# Patient Record
Sex: Male | Born: 1980 | Race: Black or African American | Hispanic: No | Marital: Single | Smoking: Current some day smoker
Health system: Southern US, Community
[De-identification: ages and names within clinical notes are randomized; demographics above are authoritative.]

## PROBLEM LIST (undated history)

## (undated) ENCOUNTER — Emergency Department (HOSPITAL_COMMUNITY): Admission: EM | Payer: Self-pay | Source: Home / Self Care

## (undated) DIAGNOSIS — F319 Bipolar disorder, unspecified: Secondary | ICD-10-CM

## (undated) DIAGNOSIS — R011 Cardiac murmur, unspecified: Secondary | ICD-10-CM

## (undated) DIAGNOSIS — J45909 Unspecified asthma, uncomplicated: Secondary | ICD-10-CM

## (undated) DIAGNOSIS — F209 Schizophrenia, unspecified: Secondary | ICD-10-CM

## (undated) HISTORY — PX: FACIAL COSMETIC SURGERY: SHX629

## (undated) HISTORY — PX: WISDOM TOOTH EXTRACTION: SHX21

## (undated) HISTORY — PX: FRACTURE SURGERY: SHX138

---

## 2010-12-05 ENCOUNTER — Emergency Department (HOSPITAL_COMMUNITY)
Admission: EM | Admit: 2010-12-05 | Discharge: 2010-12-06 | Disposition: A | Discharge status: Home / Self Care | Payer: Self-pay | Attending: Emergency Medicine | Admitting: Emergency Medicine

## 2010-12-05 ENCOUNTER — Emergency Department (HOSPITAL_COMMUNITY): Admission: EM | Admit: 2010-12-05 | Payer: Self-pay | Source: Home / Self Care

## 2010-12-05 DIAGNOSIS — Y9302 Activity, running: Secondary | ICD-10-CM | POA: Insufficient documentation

## 2010-12-05 DIAGNOSIS — X58XXXA Exposure to other specified factors, initial encounter: Secondary | ICD-10-CM | POA: Insufficient documentation

## 2010-12-05 DIAGNOSIS — S82899A Other fracture of unspecified lower leg, initial encounter for closed fracture: Secondary | ICD-10-CM | POA: Insufficient documentation

## 2010-12-06 ENCOUNTER — Emergency Department (HOSPITAL_COMMUNITY): Payer: Self-pay

## 2010-12-08 ENCOUNTER — Emergency Department (HOSPITAL_COMMUNITY)
Admission: EM | Admit: 2010-12-08 | Discharge: 2010-12-09 | Disposition: A | Discharge status: Home / Self Care | Payer: Self-pay | Attending: Emergency Medicine | Admitting: Emergency Medicine

## 2010-12-08 DIAGNOSIS — Y9302 Activity, running: Secondary | ICD-10-CM | POA: Insufficient documentation

## 2010-12-08 DIAGNOSIS — M25579 Pain in unspecified ankle and joints of unspecified foot: Secondary | ICD-10-CM | POA: Insufficient documentation

## 2010-12-08 DIAGNOSIS — S72409A Unspecified fracture of lower end of unspecified femur, initial encounter for closed fracture: Secondary | ICD-10-CM | POA: Insufficient documentation

## 2010-12-08 DIAGNOSIS — X500XXA Overexertion from strenuous movement or load, initial encounter: Secondary | ICD-10-CM | POA: Insufficient documentation

## 2010-12-13 ENCOUNTER — Emergency Department (HOSPITAL_COMMUNITY)
Admission: EM | Admit: 2010-12-13 | Discharge: 2010-12-13 | Disposition: A | Discharge status: Home / Self Care | Payer: Self-pay | Attending: Emergency Medicine | Admitting: Emergency Medicine

## 2010-12-13 DIAGNOSIS — X58XXXA Exposure to other specified factors, initial encounter: Secondary | ICD-10-CM | POA: Insufficient documentation

## 2010-12-13 DIAGNOSIS — S82899A Other fracture of unspecified lower leg, initial encounter for closed fracture: Secondary | ICD-10-CM | POA: Insufficient documentation

## 2010-12-13 DIAGNOSIS — M25579 Pain in unspecified ankle and joints of unspecified foot: Secondary | ICD-10-CM | POA: Insufficient documentation

## 2010-12-13 DIAGNOSIS — Z09 Encounter for follow-up examination after completed treatment for conditions other than malignant neoplasm: Secondary | ICD-10-CM | POA: Insufficient documentation

## 2011-04-13 ENCOUNTER — Emergency Department (HOSPITAL_COMMUNITY)
Admission: EM | Admit: 2011-04-13 | Discharge: 2011-04-14 | Disposition: A | Discharge status: Home / Self Care | Payer: Self-pay | Attending: Emergency Medicine | Admitting: Emergency Medicine

## 2011-04-13 ENCOUNTER — Encounter: Payer: Self-pay | Admitting: Emergency Medicine

## 2011-04-13 DIAGNOSIS — F17200 Nicotine dependence, unspecified, uncomplicated: Secondary | ICD-10-CM | POA: Insufficient documentation

## 2011-04-13 DIAGNOSIS — K089 Disorder of teeth and supporting structures, unspecified: Secondary | ICD-10-CM | POA: Insufficient documentation

## 2011-04-13 DIAGNOSIS — K0889 Other specified disorders of teeth and supporting structures: Secondary | ICD-10-CM

## 2011-04-13 MED ORDER — OXYCODONE-ACETAMINOPHEN 5-325 MG PO TABS
1.0000 | ORAL_TABLET | Freq: Once | ORAL | Status: AC
  Administered 2011-04-14: 1 via ORAL
  Filled 2011-04-13: qty 1

## 2011-04-13 MED ORDER — PENICILLIN V POTASSIUM 250 MG PO TABS
500.0000 mg | ORAL_TABLET | Freq: Once | ORAL | Status: AC
  Administered 2011-04-14: 500 mg via ORAL
  Filled 2011-04-13: qty 2

## 2011-04-13 NOTE — ED Provider Notes (Signed)
History     CSN: [id number] Arrival date & time: 04/13/2011 10:15 PM   First MD Initiated Contact with Patient 04/13/11 2348      Chief Complaint  Patient presents with  . Dental Pain    (Consider location/radiation/quality/duration/timing/severity/associated sxs/prior treatment) HPI Comments: Patient presents with left lower molar pain for the past one day. He loses his wisdom tooth it is not come in yet. This happened before about 3-4 months ago improved with amoxicillin and Vicodin. He is not follow up with a dentist. He denies any fevers, chills, difficulty breathing or swallowing. He does not have any trismus or drooling.  No chest pain, shortness of breath, nausea or vomiting. No trauma to his mouth. He is an active smoker.  Patient is a 30 y.o. male presenting with tooth pain. The history is provided by the patient.  Dental PainThe primary symptoms include mouth pain. Primary symptoms do not include headaches, fever, sore throat or cough. The symptoms began 6 to 12 hours ago. The symptoms are worsening. The symptoms are recurrent. The symptoms occur constantly.  Additional symptoms include: gum swelling, gum tenderness and facial swelling. Additional symptoms do not include: trismus, trouble swallowing, pain with swallowing and drooling.    History reviewed. No pertinent past medical history.  History reviewed. No pertinent past surgical history.  No family history on file.  History  Substance Use Topics  . Smoking status: Current Everyday Smoker  . Smokeless tobacco: Not on file  . Alcohol Use: Yes      Review of Systems  Constitutional: Negative for fever, activity change and appetite change.  HENT: Positive for facial swelling and dental problem. Negative for sore throat, drooling and trouble swallowing.   Respiratory: Negative for cough.   Cardiovascular: Negative for chest pain.  Gastrointestinal: Negative for nausea, vomiting and abdominal pain.  Genitourinary:  Negative for dysuria.  Neurological: Negative for dizziness and headaches.    Allergies  Review of patient's allergies indicates no known allergies.  Home Medications   Current Outpatient Rx  Name Route Sig Dispense Refill  . HYDROCODONE-ACETAMINOPHEN 5-325 MG PO TABS Oral Take 2 tablets by mouth every 4 (four) hours as needed for pain. 10 tablet 0  . PENICILLIN V POTASSIUM 500 MG PO TABS Oral Take 1 tablet (500 mg total) by mouth 4 (four) times daily. 40 tablet 0    BP 132/83  Pulse 78  Temp(Src) 98.1 F (36.7 C) (Oral)  SpO2 100%  Physical Exam  Constitutional: He is oriented to person, place, and time. He appears well-developed and well-nourished. No distress.  HENT:  Head: Normocephalic and atraumatic.  Mouth/Throat: Oropharynx is clear and moist. No oropharyngeal exudate.         Slight left cheek swelling, no trismus  Eyes: Conjunctivae and EOM are normal. Pupils are equal, round, and reactive to light.  Neck: Normal range of motion. Neck supple.       No meningismus  Cardiovascular: Normal rate, regular rhythm and normal heart sounds.   Pulmonary/Chest: Effort normal. No respiratory distress.  Abdominal: Soft. There is no tenderness. There is no rebound and no guarding.  Musculoskeletal: Normal range of motion. He exhibits no edema and no tenderness.  Lymphadenopathy:    He has no cervical adenopathy.  Neurological: He is alert and oriented to person, place, and time. No cranial nerve deficit.  Skin: Skin is warm.    ED Course  Procedures (including critical care time)  Labs Reviewed - No data to display No  results found.   1. Pain, dental       MDM  Dental pain without evidence of abscess or airway obstruction.  Gingiva over tooth #17 is inflamed.  We'll treat with pain medications, antibiotics and followup at the clinic. Patient instructed to call dentistry within 48 hours to ensure emergency followup.        Glynn Octave, MD 04/14/11  0005

## 2011-04-13 NOTE — ED Notes (Signed)
PT. REPORTS LEFT LOWER MOLAR PAIN WITH SWELLING ONSET TODAY UNRELIEVED BY OTC PAIN MEDICATIONS.

## 2011-04-14 MED ORDER — HYDROCODONE-ACETAMINOPHEN 5-325 MG PO TABS: 2.0000 | ORAL_TABLET | ORAL | Status: AC | PRN

## 2011-04-14 MED ORDER — PENICILLIN V POTASSIUM 500 MG PO TABS: 500.0000 mg | ORAL_TABLET | Freq: Four times a day (QID) | ORAL | Status: AC

## 2011-09-18 ENCOUNTER — Emergency Department (HOSPITAL_COMMUNITY)
Admission: EM | Admit: 2011-09-18 | Discharge: 2011-09-18 | Disposition: A | Discharge status: Home / Self Care | Payer: Self-pay | Attending: Emergency Medicine | Admitting: Emergency Medicine

## 2011-09-18 DIAGNOSIS — F1310 Sedative, hypnotic or anxiolytic abuse, uncomplicated: Secondary | ICD-10-CM | POA: Insufficient documentation

## 2011-09-18 DIAGNOSIS — R404 Transient alteration of awareness: Secondary | ICD-10-CM | POA: Insufficient documentation

## 2011-09-18 LAB — ACETAMINOPHEN LEVEL: Acetaminophen (Tylenol), Serum: <15 ug/mL (ref 10–30)

## 2011-09-18 LAB — COMPREHENSIVE METABOLIC PANEL
ALT: 13 U/L (ref 0–53)
BUN: 17 mg/dL (ref 6–23)
CO2: 24 mEq/L (ref 19–32)
Calcium: 8.9 mg/dL (ref 8.4–10.5)
GFR calc Af Amer: >90 mL/min (ref >90)
GFR calc non Af Amer: >90 mL/min (ref >90)
Glucose, Bld: 101 mg/dL — ABNORMAL HIGH (ref 70–99)
Potassium: 4.2 mEq/L (ref 3.5–5.1)

## 2011-09-18 LAB — CBC
Hemoglobin: 14.8 g/dL (ref 13.0–17.0)
MCHC: 34.3 g/dL (ref 30.0–36.0)
RDW: 13.8 % (ref 11.5–15.5)
WBC: 4.5 10*3/uL (ref 4.0–10.5)

## 2011-09-18 LAB — SALICYLATE LEVEL: Salicylate Lvl: <2 mg/dL — ABNORMAL LOW (ref 2.8–20.0)

## 2011-09-18 LAB — ETHANOL: Alcohol, Ethyl (B): <11 mg/dL (ref 0–11)

## 2011-09-18 LAB — GLUCOSE, CAPILLARY: Glucose-Capillary: 85 mg/dL (ref 70–99)

## 2011-09-18 NOTE — ED Notes (Signed)
Pt woke up and was alert and oriented. Pt does not remember taking any pills. Pt will be take to by [facility]
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
## 2011-09-18 NOTE — ED Notes (Signed)
Pt belongings: one pair of pants, one pair of boxers, one pair of black socks, one cut sweater.  Belongings locked in the room 6 cabinet. Security informed to search pt and belongings.

## 2011-09-18 NOTE — ED Notes (Signed)
[id number]> Expected date:09/18/11<BR> Expected time:<BR> Means of arrival:<BR> Comments:<BR> EMS 11 gC - od/lethargy/alert to verbal

## 2011-09-18 NOTE — Discharge Instructions (Signed)
Take medication only as prescribed to you by your doctor. Follow up with primary care doctor in 1 week.  Return to ER if worse, new symptoms, trouble breathing, other concern.      Substance Abuse Your exam indicates that you have a problem with substance abuse. Substance abuse is the misuse of alcohol or drugs that causes problems in family life, friendships, and work relationships. Substance abuse is the most important cause of premature illness, disability, and death in our society. It is also the greatest threat to a person's mental and spiritual well being. Substance abuse can start out in an innocent way, such as social drinking or taking a little extra medication prescribed by your doctor. No one starts out with the intention of becoming an alcoholic or an addict. Substance abuse victims cannot control their use of alcohol or drugs. They may become intoxicated daily or go on weekend binges. Often there is a strong desire to quit, but attempts to stop using often fail. Encounters with law enforcement or conflicts with family members, friends, and work associates are signs of a potential problem. Recovery is always possible, although the craving for some drugs makes it difficult to quit without assistance. Many treatment programs are available to help people stop abusing alcohol or drugs. The first step in treatment is to admit you have a problem. This is a major hurdle because denial is a powerful force with substance abuse. Alcoholics Anonymous, Narcotics Anonymous, Cocaine Anonymous, and other recovery groups and programs can be very useful in helping people to quit. If you do not feel okay about your drug or alcohol use and if it is causing you trouble, we want to encourage you to talk about it with your doctor or with someone from a recovery group who can help you. You could also call the [employer] on Drug Abuse at 1-800-662-HELP. It is up to you to take the first step. [employer]-ANON and  ALA-TEEN are support groups for friends and family members of an alcohol or drug dependent person. The people who love and care for the alcoholic or addicted person often need help, too. For information about these organizations, check your phone directory or call a local alcohol or drug treatment center. Document Released: 06/01/2004 Document Revised: 04/13/2011 Document Reviewed: 04/25/2008 [facility] Patient Information 2012 [place], [place].

## 2011-09-18 NOTE — ED Notes (Signed)
Pt leaving in [employer] custody.

## 2012-02-19 ENCOUNTER — Emergency Department (HOSPITAL_COMMUNITY)
Admission: EM | Admit: 2012-02-19 | Discharge: 2012-02-19 | Disposition: A | Discharge status: Home / Self Care | Payer: No Typology Code available for payment source | Attending: Emergency Medicine | Admitting: Emergency Medicine

## 2012-02-19 ENCOUNTER — Emergency Department (HOSPITAL_COMMUNITY): Payer: No Typology Code available for payment source

## 2012-02-19 ENCOUNTER — Encounter (HOSPITAL_COMMUNITY): Payer: Self-pay

## 2012-02-19 DIAGNOSIS — R109 Unspecified abdominal pain: Secondary | ICD-10-CM

## 2012-02-19 DIAGNOSIS — T1490XA Injury, unspecified, initial encounter: Secondary | ICD-10-CM | POA: Insufficient documentation

## 2012-02-19 DIAGNOSIS — M25559 Pain in unspecified hip: Secondary | ICD-10-CM | POA: Insufficient documentation

## 2012-02-19 LAB — URINALYSIS, ROUTINE W REFLEX MICROSCOPIC
Glucose, UA: NEGATIVE mg/dL
Ketones, ur: NEGATIVE mg/dL
Leukocytes, UA: NEGATIVE
Nitrite: NEGATIVE
Protein, ur: NEGATIVE mg/dL
pH: 5.5 (ref 5.0–8.0)

## 2012-02-19 LAB — CBC
HCT: 49.8 % (ref 39.0–52.0)
Platelets: 227 10*3/uL (ref 150–400)
RBC: 5.81 MIL/uL (ref 4.22–5.81)
RDW: 13.9 % (ref 11.5–15.5)
WBC: 10.8 10*3/uL — ABNORMAL HIGH (ref 4.0–10.5)

## 2012-02-19 LAB — COMPREHENSIVE METABOLIC PANEL
AST: 17 U/L (ref 0–37)
Albumin: 4.5 g/dL (ref 3.5–5.2)
Alkaline Phosphatase: 89 U/L (ref 39–117)
Chloride: 104 mEq/L (ref 96–112)
Potassium: 4 mEq/L (ref 3.5–5.1)
Total Bilirubin: 0.4 mg/dL (ref 0.3–1.2)

## 2012-02-19 LAB — LIPASE, BLOOD: Lipase: 21 U/L (ref 11–59)

## 2012-02-19 MED ORDER — ONDANSETRON HCL 4 MG/2ML IJ SOLN
4.0000 mg | Freq: Once | INTRAMUSCULAR | Status: AC
  Administered 2012-02-19: 4 mg via INTRAVENOUS
  Filled 2012-02-19: qty 2

## 2012-02-19 MED ORDER — ONDANSETRON HCL 4 MG PO TABS: 4.0000 mg | ORAL_TABLET | Freq: Four times a day (QID) | ORAL | Status: DC

## 2012-02-19 MED ORDER — IBUPROFEN 800 MG PO TABS
800.0000 mg | ORAL_TABLET | Freq: Once | ORAL | Status: AC
  Administered 2012-02-19: 800 mg via ORAL
  Filled 2012-02-19: qty 1

## 2012-02-19 MED ORDER — IBUPROFEN 600 MG PO TABS: 600.0000 mg | ORAL_TABLET | Freq: Four times a day (QID) | ORAL | Status: DC | PRN

## 2012-02-19 MED ORDER — IOHEXOL 300 MG/ML  SOLN
100.0000 mL | Freq: Once | INTRAMUSCULAR | Status: AC | PRN
  Administered 2012-02-19: 100 mL via INTRAVENOUS
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
## 2012-02-19 NOTE — ED Notes (Signed)
Patient transported to X-ray 

## 2012-11-11 ENCOUNTER — Emergency Department (HOSPITAL_COMMUNITY): Payer: No Typology Code available for payment source

## 2012-11-11 ENCOUNTER — Encounter (HOSPITAL_COMMUNITY): Payer: Self-pay | Admitting: *Deleted

## 2012-11-11 ENCOUNTER — Emergency Department (HOSPITAL_COMMUNITY)
Admission: EM | Admit: 2012-11-11 | Discharge: 2012-11-11 | Disposition: A | Discharge status: Home / Self Care | Payer: No Typology Code available for payment source | Attending: Emergency Medicine | Admitting: Emergency Medicine

## 2012-11-11 DIAGNOSIS — F17200 Nicotine dependence, unspecified, uncomplicated: Secondary | ICD-10-CM | POA: Insufficient documentation

## 2012-11-11 DIAGNOSIS — R4789 Other speech disturbances: Secondary | ICD-10-CM | POA: Insufficient documentation

## 2012-11-11 DIAGNOSIS — Y939 Activity, unspecified: Secondary | ICD-10-CM | POA: Insufficient documentation

## 2012-11-11 DIAGNOSIS — S060X0A Concussion without loss of consciousness, initial encounter: Secondary | ICD-10-CM | POA: Insufficient documentation

## 2012-11-11 DIAGNOSIS — Y92410 Unspecified street and highway as the place of occurrence of the external cause: Secondary | ICD-10-CM | POA: Insufficient documentation

## 2012-11-11 DIAGNOSIS — Z79899 Other long term (current) drug therapy: Secondary | ICD-10-CM | POA: Insufficient documentation

## 2012-11-11 MED ORDER — ONDANSETRON 4 MG PO TBDP
4.0000 mg | ORAL_TABLET | Freq: Once | ORAL | Status: AC
  Administered 2012-11-11: 4 mg via ORAL
  Filled 2012-11-11: qty 1

## 2012-11-11 MED ORDER — HYDROCODONE-ACETAMINOPHEN 5-325 MG PO TABS: 1.0000 | ORAL_TABLET | ORAL | Status: DC | PRN

## 2012-11-11 MED ORDER — CYCLOBENZAPRINE HCL 10 MG PO TABS: 10.0000 mg | ORAL_TABLET | Freq: Two times a day (BID) | ORAL | Status: DC | PRN

## 2012-11-11 MED ORDER — HYDROCODONE-ACETAMINOPHEN 5-325 MG PO TABS
1.0000 | ORAL_TABLET | Freq: Once | ORAL | Status: AC
  Administered 2012-11-11: 1 via ORAL
  Filled 2012-11-11: qty 1

## 2012-11-11 NOTE — ED Notes (Signed)
Pt walked in hallway, no difficulties, states "I feel fine".
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
## 2012-11-11 NOTE — ED Notes (Signed)
Patient transported to X-ray 
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
## 2012-11-11 NOTE — ED Notes (Signed)
Family member states pt appears to be disoriented; oriented to person/place/time/event

## 2012-11-12 NOTE — ED Provider Notes (Signed)
Medical screening examination/treatment/procedure(s) were performed by non-physician practitioner and as supervising physician I was immediately available for consultation/collaboration.   Hurman Horn, MD 11/12/12 2104

## 2013-02-06 ENCOUNTER — Emergency Department (HOSPITAL_COMMUNITY)
Admission: EM | Admit: 2013-02-06 | Discharge: 2013-02-06 | Disposition: A | Discharge status: Home / Self Care | Payer: No Typology Code available for payment source | Attending: Emergency Medicine | Admitting: Emergency Medicine

## 2013-02-06 ENCOUNTER — Encounter (HOSPITAL_COMMUNITY): Payer: Self-pay

## 2013-02-06 DIAGNOSIS — Z79899 Other long term (current) drug therapy: Secondary | ICD-10-CM | POA: Insufficient documentation

## 2013-02-06 DIAGNOSIS — A64 Unspecified sexually transmitted disease: Secondary | ICD-10-CM | POA: Insufficient documentation

## 2013-02-06 DIAGNOSIS — F17200 Nicotine dependence, unspecified, uncomplicated: Secondary | ICD-10-CM | POA: Insufficient documentation

## 2013-02-06 MED ORDER — CEFTRIAXONE SODIUM 250 MG IJ SOLR
250.0000 mg | Freq: Once | INTRAMUSCULAR | Status: AC
  Administered 2013-02-06: 250 mg via INTRAMUSCULAR
  Filled 2013-02-06: qty 250

## 2013-02-06 MED ORDER — STERILE WATER FOR INJECTION IJ SOLN
INTRAMUSCULAR | Status: AC
  Administered 2013-02-06: 10 mL
  Filled 2013-02-06: qty 10

## 2013-02-06 MED ORDER — AZITHROMYCIN 250 MG PO TABS
1000.0000 mg | ORAL_TABLET | Freq: Once | ORAL | Status: AC
  Administered 2013-02-06: 1000 mg via ORAL
  Filled 2013-02-06: qty 4

## 2013-02-06 MED ORDER — METRONIDAZOLE 500 MG PO TABS
2000.0000 mg | ORAL_TABLET | Freq: Once | ORAL | Status: AC
  Administered 2013-02-06: 2000 mg via ORAL
  Filled 2013-02-06: qty 4

## 2013-02-06 NOTE — ED Notes (Signed)
Pt presents with c/o penile discharge that began yesterday. Pt says he had sex on Monday with his best friend and yesterday he noticed that he had some penile discharge. Pt says the discharge was white and creamy.

## 2013-02-06 NOTE — ED Provider Notes (Signed)
CSN: [id number]     Arrival date & time 02/06/13  1805 History   First MD Initiated Contact with Patient 02/06/13 1813     Chief Complaint  Patient presents with  . Penile Discharge   (Consider location/radiation/quality/duration/timing/severity/associated sxs/prior Treatment) HPI Patient presents to the ER with penile discharge for the last 2 days.  Patient, states, that he had unprotected sex on Monday.  Patient, states, that he started having discharge.  On Tuesday and difficulty with urination.  Patient, states he did not take any medications prior to arrival History reviewed. No pertinent past medical history. History reviewed. No pertinent past surgical history. No family history on file. History  Substance Use Topics  . Smoking status: Current Every Day Smoker  . Smokeless tobacco: Never Used  . Alcohol Use: Yes     Comment: occasional    Review of Systems All other systems negative except as documented in the HPI. All pertinent positives and negatives as reviewed in the HPI. Allergies  Review of patient's allergies indicates no known allergies.  Home Medications   Current Outpatient Rx  Name  Route  Sig  Dispense  Refill  . lurasidone (LATUDA) 40 MG TABS tablet   Oral   Take 40 mg by mouth daily as needed (depression/anxiety).         . sertraline (ZOLOFT) 25 MG tablet   Oral   Take 25 mg by mouth daily as needed (depression/anxiety).          BP 129/94  Pulse 91  Temp(Src) 98.9 F (37.2 C) (Oral)  Resp 20  Ht 5\' 11"  (1.803 m)  Wt 205 lb (92.987 kg)  BMI 28.6 kg/m2  SpO2 99% Physical Exam  Nursing note and vitals reviewed. Constitutional: He is oriented to person, place, and time. He appears well-developed and well-nourished. No distress.  Cardiovascular: Normal rate, regular rhythm and normal heart sounds.  Exam reveals no gallop and no friction rub.   No murmur heard. Pulmonary/Chest: Effort normal and breath sounds normal. No respiratory distress.   Genitourinary: Testes normal. No penile erythema. Discharge found.  Neurological: He is alert and oriented to person, place, and time.  Skin: Skin is warm and dry.    ED Course  Procedures (including critical care time) Patient be treated for presumed sexual transmitted disease.  He is advised about safer sex practices.  Patient is advised to return here as needed.  Carlyle Dolly, PA-C 02/06/13 1943

## 2013-02-07 NOTE — ED Provider Notes (Signed)
Medical screening examination/treatment/procedure(s) were performed by non-physician practitioner and as supervising physician I was immediately available for consultation/collaboration.   Rolan Bucco, MD 02/07/13 0001

## 2013-08-02 ENCOUNTER — Encounter (HOSPITAL_COMMUNITY): Payer: Self-pay | Admitting: Emergency Medicine

## 2013-08-02 ENCOUNTER — Emergency Department (HOSPITAL_COMMUNITY)
Admission: EM | Admit: 2013-08-02 | Discharge: 2013-08-02 | Disposition: A | Discharge status: Home / Self Care | Payer: No Typology Code available for payment source | Attending: Emergency Medicine | Admitting: Emergency Medicine

## 2013-08-02 DIAGNOSIS — F17200 Nicotine dependence, unspecified, uncomplicated: Secondary | ICD-10-CM | POA: Insufficient documentation

## 2013-08-02 DIAGNOSIS — R259 Unspecified abnormal involuntary movements: Secondary | ICD-10-CM | POA: Insufficient documentation

## 2013-08-02 DIAGNOSIS — K047 Periapical abscess without sinus: Secondary | ICD-10-CM | POA: Insufficient documentation

## 2013-08-02 MED ORDER — OXYCODONE-ACETAMINOPHEN 5-325 MG PO TABS: 2.0000 | ORAL_TABLET | ORAL | Status: DC | PRN

## 2013-08-02 MED ORDER — PENICILLIN V POTASSIUM 500 MG PO TABS: 500.0000 mg | ORAL_TABLET | Freq: Three times a day (TID) | ORAL | Status: DC

## 2013-08-02 NOTE — ED Provider Notes (Signed)
CSN: [id number]     Arrival date & time 08/02/13  1339 History   First MD Initiated Contact with Patient 08/02/13 1351    This chart was scribed for Fayrene HelperBowie Bleu Moisan PA-C, a non-physician practitioner working with Gerhard Munchobert Lockwood, MD by Lewanda RifeAlexandra Hurtado, ED Scribe. This patient was seen in room TR09C/TR09C and the patient's care was started at 1:58 PM      Chief Complaint  Patient presents with  . Dental Pain     (Consider location/radiation/quality/duration/timing/severity/associated sxs/prior Treatment) The history is provided by the patient. No language interpreter was used.   HPI Comments: Wandra ArthursLarry Travis Ozturk is a 33 y.o. male who presents to the Emergency Department complaining of constant severe right lower dental pain onset 2 days. Describes pain as throbbing and radiating down right neck. Reports associated right sided facial swelling. Reports pain is exacerbated by cold air, hot water, and chewing. Reports taking percocet (he gets from people) with relief of symptoms. Denies trying any other alleviating factors. Denies associated fever, sore throat, difficulty breathing, and dysphagia. States he smokes cigarettes.   History reviewed. No pertinent past medical history. History reviewed. No pertinent past surgical history. History reviewed. No pertinent family history. History  Substance Use Topics  . Smoking status: Current Every Day Smoker    Types: Cigarettes  . Smokeless tobacco: Never Used  . Alcohol Use: Yes     Comment: occasional    Review of Systems  Constitutional: Negative for fever.  HENT: Positive for dental problem.   Respiratory: Negative for shortness of breath.   Psychiatric/Behavioral: Negative for confusion.      Allergies  Review of patient's allergies indicates no known allergies.  Home Medications   Current Outpatient Rx  Name  Route  Sig  Dispense  Refill  . lurasidone (LATUDA) 40 MG TABS tablet   Oral   Take 40 mg by mouth daily as needed  (depression/anxiety).         . sertraline (ZOLOFT) 25 MG tablet   Oral   Take 25 mg by mouth daily as needed (depression/anxiety).          BP 113/77  Pulse 84  Temp(Src) 97.7 F (36.5 C) (Oral)  Resp 18  SpO2 98% Physical Exam  Nursing note and vitals reviewed. Constitutional: He is oriented to person, place, and time. He appears well-developed and well-nourished. No distress.  HENT:  Head: Normocephalic and atraumatic.  Mouth/Throat: Uvula is midline, oropharynx is clear and moist and mucous membranes are normal. There is trismus in the jaw. No dental abscesses. No oropharyngeal exudate, posterior oropharyngeal edema, posterior oropharyngeal erythema or tonsillar abscesses.  TTP to right lower tooth number 32  Right side: Mildly edematous adjacent cheek and erythema gingiva,    No obvious signs of deep tissue infection, no tongue swelling, no oropharyngeal involvement  No signs of peritonsillar or tonsillar abscess. No signs of gingival abscess. Oropharynx is clear and without exudates.  Uvula is midline.  Airway is intact. No signs of Ludwig's angina with palpation of the oral and sublingual mucosa.     Eyes: EOM are normal.  Neck: Neck supple. No tracheal deviation present.  Cardiovascular: Normal rate.   Pulmonary/Chest: Effort normal. No respiratory distress.  Musculoskeletal: Normal range of motion.  Neurological: He is alert and oriented to person, place, and time.  Skin: Skin is warm and dry.  Psychiatric: He has a normal mood and affect. His behavior is normal.    ED Course  Procedures  COORDINATION OF  CARE:  Nursing notes reviewed. Vital signs reviewed. Initial pt interview and examination performed.   2:10 PM-Discussed treatment plan with pt at bedside. Pt agrees with plan. Offered pt a dental block and pt declined. Is afebrile, no sxs infection.  Will provide abx and pain meds.  Dental referral given.     Treatment plan initiated:Medications - No data  to display   Initial diagnostic testing ordered.     Labs Review Labs Reviewed - No data to display Imaging Review No results found.   EKG Interpretation None      MDM   Final diagnoses:  Periapical abscess with facial involvement    BP 113/77  Pulse 84  Temp(Src) 97.7 F (36.5 C) (Oral)  Resp 18  SpO2 98%  I personally performed the services described in this documentation, which was scribed in my presence. The recorded information has been reviewed and is accurate.     Fayrene Helper, PA-C 08/02/13 1416

## 2013-08-02 NOTE — Discharge Instructions (Signed)
°  Dental Abscess °A dental abscess is a collection of infected fluid (pus) from a bacterial infection in the inner part of the tooth (pulp). It usually occurs at the end of the tooth's root.  °CAUSES  °· Severe tooth decay. °· Trauma to the tooth that allows bacteria to enter into the pulp, such as a broken or chipped tooth. °SYMPTOMS  °· Severe pain in and around the infected tooth. °· Swelling and redness around the abscessed tooth or in the mouth or face. °· Tenderness. °· Pus drainage. °· Bad breath. °· Bitter taste in the mouth. °· Difficulty swallowing. °· Difficulty opening the mouth. °· Nausea. °· Vomiting. °· Chills. °· Swollen neck glands. °DIAGNOSIS  °· A medical and dental history will be taken. °· An examination will be performed by tapping on the abscessed tooth. °· X-rays may be taken of the tooth to identify the abscess. °TREATMENT °The goal of treatment is to eliminate the infection. You may be prescribed antibiotic medicine to stop the infection from spreading. A root canal may be performed to save the tooth. If the tooth cannot be saved, it may be pulled (extracted) and the abscess may be drained.  °HOME CARE INSTRUCTIONS °· Only take over-the-counter or prescription medicines for pain, fever, or discomfort as directed by your caregiver. °· Rinse your mouth (gargle) often with salt water (¼ tsp salt in 8 oz [250 ml] of warm water) to relieve pain or swelling. °· Do not drive after taking pain medicine (narcotics). °· Do not apply heat to the outside of your face. °· Return to your dentist for further treatment as directed. °SEEK MEDICAL CARE IF: °· Your pain is not helped by medicine. °· Your pain is getting worse instead of better. °SEEK IMMEDIATE MEDICAL CARE IF: °· You have a fever or persistent symptoms for more than 2 3 days. °· You have a fever and your symptoms suddenly get worse. °· You have chills or a very bad headache. °· You have problems breathing or swallowing. °· You have trouble  opening your mouth. °· You have swelling in the neck or around the eye. °Document Released: 04/24/2005 Document Revised: 01/17/2012 Document Reviewed: 08/02/2010 °[employer]® Patient Information ©2014 [employer]. ° ° °

## 2013-08-02 NOTE — ED Notes (Signed)
PA at bedside.

## 2013-08-02 NOTE — ED Notes (Signed)
PT ambulated with baseline gait; VSS; A&Ox3; no signs of distress; respirations even and unlabored; skin warm and dry; no questions upon discharge.  

## 2013-08-02 NOTE — ED Provider Notes (Signed)
  Medical screening examination/treatment/procedure(s) were performed by non-physician practitioner and as supervising physician I was immediately available for consultation/collaboration.   EKG Interpretation None         Gerhard Munchobert Monic Engelmann, MD 08/02/13 1535

## 2013-08-02 NOTE — ED Notes (Signed)
Reports right upper dental pain/abscess x 2 days. Swelling noted to face, airway intact.

## 2013-08-03 ENCOUNTER — Emergency Department (HOSPITAL_COMMUNITY): Payer: Self-pay

## 2013-08-03 ENCOUNTER — Emergency Department (HOSPITAL_COMMUNITY)
Admission: EM | Admit: 2013-08-03 | Discharge: 2013-08-03 | Disposition: A | Discharge status: Home / Self Care | Payer: Self-pay | Attending: Emergency Medicine | Admitting: Emergency Medicine

## 2013-08-03 ENCOUNTER — Encounter (HOSPITAL_COMMUNITY): Payer: Self-pay | Admitting: Emergency Medicine

## 2013-08-03 DIAGNOSIS — R259 Unspecified abnormal involuntary movements: Secondary | ICD-10-CM | POA: Insufficient documentation

## 2013-08-03 DIAGNOSIS — K029 Dental caries, unspecified: Secondary | ICD-10-CM | POA: Insufficient documentation

## 2013-08-03 DIAGNOSIS — K047 Periapical abscess without sinus: Secondary | ICD-10-CM | POA: Insufficient documentation

## 2013-08-03 DIAGNOSIS — F17200 Nicotine dependence, unspecified, uncomplicated: Secondary | ICD-10-CM | POA: Insufficient documentation

## 2013-08-03 DIAGNOSIS — Z79899 Other long term (current) drug therapy: Secondary | ICD-10-CM | POA: Insufficient documentation

## 2013-08-03 LAB — CBC WITH DIFFERENTIAL/PLATELET
Basophils Absolute: 0 10*3/uL (ref 0.0–0.1)
Basophils Relative: 1 % (ref 0–1)
EOS ABS: 0.3 10*3/uL (ref 0.0–0.7)
EOS PCT: 3 % (ref 0–5)
HCT: 42.9 % (ref 39.0–52.0)
Hemoglobin: 14.8 g/dL (ref 13.0–17.0)
LYMPHS ABS: 2.2 10*3/uL (ref 0.7–4.0)
Lymphocytes Relative: 25 % (ref 12–46)
MCH: 29.4 pg (ref 26.0–34.0)
MCHC: 34.5 g/dL (ref 30.0–36.0)
MCV: 85.1 fL (ref 78.0–100.0)
MONOS PCT: 7 % (ref 3–12)
Monocytes Absolute: 0.6 10*3/uL (ref 0.1–1.0)
Neutro Abs: 5.6 10*3/uL (ref 1.7–7.7)
Neutrophils Relative %: 64 % (ref 43–77)
PLATELETS: 263 10*3/uL (ref 150–400)
RBC: 5.04 MIL/uL (ref 4.22–5.81)
RDW: 13.7 % (ref 11.5–15.5)
WBC: 8.8 10*3/uL (ref 4.0–10.5)

## 2013-08-03 LAB — BASIC METABOLIC PANEL
BUN: 8 mg/dL (ref 6–23)
CALCIUM: 9.7 mg/dL (ref 8.4–10.5)
CO2: 27 mEq/L (ref 19–32)
CREATININE: 0.96 mg/dL (ref 0.50–1.35)
Chloride: 97 mEq/L (ref 96–112)
GFR calc Af Amer: >90 mL/min (ref >90)
GLUCOSE: 112 mg/dL — AB (ref 70–99)
Potassium: 3.4 mEq/L — ABNORMAL LOW (ref 3.7–5.3)
Sodium: 137 mEq/L (ref 137–147)

## 2013-08-03 MED ORDER — ONDANSETRON HCL 4 MG/2ML IJ SOLN
4.0000 mg | Freq: Once | INTRAMUSCULAR | Status: AC
  Administered 2013-08-03: 4 mg via INTRAVENOUS
  Filled 2013-08-03: qty 2

## 2013-08-03 MED ORDER — SODIUM CHLORIDE 0.9 % IV BOLUS (SEPSIS)
1000.0000 mL | Freq: Once | INTRAVENOUS | Status: AC
  Administered 2013-08-03: 1000 mL via INTRAVENOUS

## 2013-08-03 MED ORDER — MORPHINE SULFATE 4 MG/ML IJ SOLN: 4.0000 mg | Freq: Once | INTRAMUSCULAR | Status: DC

## 2013-08-03 MED ORDER — OXYCODONE-ACETAMINOPHEN 5-325 MG PO TABS
2.0000 | ORAL_TABLET | Freq: Once | ORAL | Status: AC
  Administered 2013-08-03: 2 via ORAL
  Filled 2013-08-03: qty 2

## 2013-08-03 MED ORDER — CLINDAMYCIN HCL 150 MG PO CAPS: 150.0000 mg | ORAL_CAPSULE | Freq: Four times a day (QID) | ORAL | Status: DC

## 2013-08-03 MED ORDER — CLINDAMYCIN PHOSPHATE 600 MG/50ML IV SOLN
600.0000 mg | Freq: Once | INTRAVENOUS | Status: AC
  Administered 2013-08-03: 600 mg via INTRAVENOUS
  Filled 2013-08-03: qty 50

## 2013-08-03 MED ORDER — OXYCODONE-ACETAMINOPHEN 5-325 MG PO TABS: 2.0000 | ORAL_TABLET | ORAL | Status: DC | PRN

## 2013-08-03 MED ORDER — MORPHINE SULFATE 4 MG/ML IJ SOLN
4.0000 mg | Freq: Once | INTRAMUSCULAR | Status: AC
  Administered 2013-08-03: 4 mg via INTRAVENOUS
  Filled 2013-08-03: qty 1

## 2013-08-03 MED ORDER — MORPHINE SULFATE 4 MG/ML IJ SOLN
4.0000 mg | Freq: Once | INTRAMUSCULAR | Status: DC
  Filled 2013-08-03: qty 1

## 2013-08-03 MED ORDER — IOHEXOL 300 MG/ML  SOLN
100.0000 mL | Freq: Once | INTRAMUSCULAR | Status: AC | PRN
  Administered 2013-08-03: 100 mL via INTRAVENOUS

## 2013-08-03 NOTE — ED Provider Notes (Signed)
CSN: [id number]     Arrival date & time 08/03/13  9562 History   First MD Initiated Contact with Patient 08/03/13 [phone]     Chief Complaint  Patient presents with  . Dental Pain     (Consider location/radiation/quality/duration/timing/severity/associated sxs/prior Treatment) HPI Patient presents to the ER with complaints of dental pain. He  Has had the pain for a total of three days now. He was prescribed Penicillin for a dental abscess but describes getting rapidly worse. HE now does not want to open his jaw due to pain and feels as though his breathing is being affected. He has not noticed any fevers and the medication is not helping his pain. He denies sore throat, nausea, vomiting or diarrhea.   History reviewed. No pertinent past medical history. History reviewed. No pertinent past surgical history. No family history on file. History  Substance Use Topics  . Smoking status: Current Every Day Smoker    Types: Cigarettes  . Smokeless tobacco: Never Used  . Alcohol Use: Yes     Comment: occasional    Review of Systems  The patient denies anorexia, fever, weight loss,, vision loss, decreased hearing, hoarseness, chest pain, syncope, dyspnea on exertion, peripheral edema, balance deficits, hemoptysis, abdominal pain, melena, hematochezia, severe indigestion/heartburn, hematuria, incontinence, genital sores, muscle weakness, suspicious skin lesions, transient blindness, difficulty walking, depression, unusual weight change, abnormal bleeding, enlarged lymph nodes, angioedema, and breast masses.   Allergies  Review of patient's allergies indicates no known allergies.  Home Medications   Current Outpatient Rx  Name  Route  Sig  Dispense  Refill  . lurasidone (LATUDA) 40 MG TABS tablet   Oral   Take 40 mg by mouth daily as needed (depression/anxiety).         . penicillin v potassium (VEETID) 500 MG tablet   Oral   Take 1 tablet (500 mg total) by mouth 3 (three) times daily.   30 tablet   0   . sertraline (ZOLOFT) 25 MG tablet   Oral   Take 25 mg by mouth daily as needed (depression/anxiety).         . clindamycin (CLEOCIN) 150 MG capsule   Oral   Take 1 capsule (150 mg total) by mouth every 6 (six) hours.   28 capsule   0   . oxyCODONE-acetaminophen (PERCOCET/ROXICET) 5-325 MG per tablet   Oral   Take 2 tablets by mouth every 4 (four) hours as needed for severe pain.   15 tablet   0    BP 122/75  Pulse 68  Temp(Src) 97.9 F (36.6 C) (Oral)  Resp 16  SpO2 96% Physical Exam  Nursing note and vitals reviewed. Constitutional: He appears well-developed and well-nourished.  HENT:  Head: Normocephalic and atraumatic.  Mouth/Throat: Uvula is midline and oropharynx is clear and moist. There is trismus in the jaw. Dental abscesses and dental caries present. No uvula swelling.    Eyes: Conjunctivae and EOM are normal. Pupils are equal, round, and reactive to light.  Neck: Normal range of motion. Neck supple.  Cardiovascular: Normal rate and regular rhythm.   Pulmonary/Chest: Effort normal and breath sounds normal.    ED Course  Procedures (including critical care time) Labs Review Labs Reviewed  BASIC METABOLIC PANEL - Abnormal; Notable for the following:    Potassium 3.4 (*)    Glucose, Bld 112 (*)    All other components within normal limits  CBC WITH DIFFERENTIAL   Imaging Review Ct Soft Tissue Neck W  Contrast  08/03/2013   CLINICAL DATA:  Right-sided dental pain, swelling.  EXAM: CT NECK WITH CONTRAST  TECHNIQUE: Multidetector CT imaging of the neck was performed using the standard protocol following the bolus administration of intravenous contrast.  CONTRAST:  100mL OMNIPAQUE IOHEXOL 300 MG/ML  SOLN  COMPARISON:  None.  FINDINGS: Soft tissue swelling noted in the subcutaneous soft tissues of the right face. Mucosal thickening in the right maxillary sinus. Slight lucency noted around the right lower wisdom tooth. Slight lucency noted around  the adjacent right lower molar. This could represent very early periapical abscesses.  No drainable soft tissue fluid collection to suggest abscess. Parotid and submandibular glands are unremarkable. Small scattered cervical lymph nodes, none pathologically enlarged. These are likely reactive. Epiglottis and aryepiglottic folds are normal. Airways patent. No retropharyngeal soft tissue swelling.  Visualized lung apices are clear.  IMPRESSION: Soft tissue stranding within the subcutaneous soft tissues in the right side of the face. Question early periapical abscesses of the right lower wisdom tooth and adjacent right lower molar.   Electronically Signed   By: Charlett NoseKevin  Dover M.D.   On: 08/03/2013 10:08     EKG Interpretation None      MDM   Final diagnoses:  Apical abscess    I spoke with Dr. Freida BusmanAllen who recommended discussing case with Dr. Barbette MerinoJensen before CT soft tissue neck to evaluate dental abscess. Dr. Barbette MerinoJensen wants CT before determining plan.  IV clinda and pain medication given in the ED.  The patients CT scan shows early apical abscess to the tooth. Discussed with DR. Barbette MerinoJensen, pt afebrile, will refill pain medication and write for clindamycin. He will follow-up in Dr. Christin FudgeJensens office tomorrow.  33 y.o.Donnetta HutchingLarry Travis Tarbet's evaluation in the Emergency Department is complete. It has been determined that no acute conditions requiring further emergency intervention are present at this time. The patient/guardian have been advised of the diagnosis and plan. We have discussed signs and symptoms that warrant return to the ED, such as changes or worsening in symptoms.  Vital signs are stable at discharge. Filed Vitals:   08/03/13 0932  BP: 122/75  Pulse: 68  Temp:   Resp: 16    Patient/guardian has voiced understanding and agreed to follow-up with the PCP or specialist.     Dorthula Matasiffany G Claus Silvestro, PA-C 08/03/13 1052

## 2013-08-03 NOTE — ED Notes (Addendum)
Pt from home c/o of right sided dental pain. [facility] and Dx with Periapical abcess with facial involvement and was given Veetid and Percocet for pain. He reports pain and swelling is worse than yesterday and he is having trouble breathing.

## 2013-08-03 NOTE — ED Notes (Signed)
Pt drowsy, 02 sat 93%. Will reeval pt prior to giving pain med.

## 2013-08-03 NOTE — ED Notes (Signed)
Patient transported to CT 

## 2013-08-03 NOTE — Discharge Instructions (Signed)
Dental Abscess A dental abscess is a collection of infected fluid (pus) from a bacterial infection in the inner part of the tooth (pulp). It usually occurs at the end of the tooth's root.  CAUSES   Severe tooth decay.  Trauma to the tooth that allows bacteria to enter into the pulp, such as a broken or chipped tooth. SYMPTOMS   Severe pain in and around the infected tooth.  Swelling and redness around the abscessed tooth or in the mouth or face.  Tenderness.  Pus drainage.  Bad breath.  Bitter taste in the mouth.  Difficulty swallowing.  Difficulty opening the mouth.  Nausea.  Vomiting.  Chills.  Swollen neck glands. DIAGNOSIS   A medical and dental history will be taken.  An examination will be performed by tapping on the abscessed tooth.  X-rays may be taken of the tooth to identify the abscess. TREATMENT The goal of treatment is to eliminate the infection. You may be prescribed antibiotic medicine to stop the infection from spreading. A root canal may be performed to save the tooth. If the tooth cannot be saved, it may be pulled (extracted) and the abscess may be drained.  HOME CARE INSTRUCTIONS  Only take over-the-counter or prescription medicines for pain, fever, or discomfort as directed by your caregiver.  Rinse your mouth (gargle) often with salt water ( tsp salt in 8 oz [250 ml] of warm water) to relieve pain or swelling.  Do not drive after taking pain medicine (narcotics).  Do not apply heat to the outside of your face.  Return to your dentist for further treatment as directed. SEEK MEDICAL CARE IF:  Your pain is not helped by medicine.  Your pain is getting worse instead of better. SEEK IMMEDIATE MEDICAL CARE IF:  You have a fever or persistent symptoms for more than 2 3 days.  You have a fever and your symptoms suddenly get worse.  You have chills or a very bad headache.  You have problems breathing or swallowing.  You have trouble  opening your mouth.  You have swelling in the neck or around the eye. Document Released: 04/24/2005 Document Revised: 01/17/2012 Document Reviewed: 08/02/2010 [facility] Patient Information 2014 [place], [place].  Dental Extraction A dental extraction procedure refers to a routine tooth extraction performed by your dentist. The procedure depends on where and how the tooth is positioned. The procedure can be very quick, sometimes lasting only seconds. Reasons for dental extraction include:  Tooth decay.  Infections (abcesses).  The need to make room for other teeth.  Gum disease s where the supporting bone has been destroyed.  Fractures of the tooth leaving it unrestorable.  Extra teeth (supernumerary) or grossly malformed teeth.  Baby teeththat have not fallen out in time and have not permitted the the permanent teeth to erupt properly.  In preparation for braces where there is not enough room to align the teeth properly.  Not enough room for wisdom teeth (particularly those that are impacted).  Prior to receiving radiation to the head and neck,teeth in the field of radiation may need to be extracted. LET YOUR CAREGIVER KNOW ABOUT:  Any allergies.  All medicines you are taking:  Including herbs, eye drops, over-the-counter medications, and creams.  Blood thinners (anticoagulants), aspirin or other drugs that may affect blood clotting.  Use of steroids (through mouth or as creams).  Previous problems with anesthetics, including local anesthetics.  History of bleeding or blood problems.  Previous surgery.  Possibility of pregnancy if  this applies.  Smoking history.  Any health problems. RISKS AND COMPLICATIONS As with any procedure, complications may occur, but they can usually be managed by your caregiver. General surgical complications may include:  Reaction to anesthesia.  Damage to surrounding teeth, nerves, tissues, or  structures.  Infection.  Bleeding. With appropriate treatment and care after surgery, the following complications are very uncommon:  Dry socket (blood clot does not form or stay in place over empty socket). This can delay healing.  Incomplete extraction of roots.  Jawbone injury, pain, or weakness. BEFORE THE PROCEDURE  Your dental care provider will:  Take a medical and dental history.  Take an X-ray to evaluate the circumstances and how to best extract the tooth.  Do an oral exam.  Depending on the situation, antibiotics may be given before or after the extraction, or before and after.  Your caregivers may review the procedure, the local anaesthesia and/or sedation being used, and what to expect after the procedure with you.  If needed, your dentist may give you a form of sedation, either by medicine you swallow, gas, or intravenously (IV). This will help to relieve anxiety. Complicated extractions may require the use of general anaesthesia. It is important to follow your caregiver's instructions prior to your procedure to avoid complications. Steps before your procedure may include:  Alert your caregiver if you feel ill (sore throat, fever, upset stomach, etc.) in the days leading up to your procedure.  Stop taking certain medications for several days prior to your procedure such as blood thinners.  Take certain medications, such as antibiotics.  Avoid eating and drinking for several hours before the procedure. This will help you to avoid complications from the sedation or anaesthesia.  Sign a patient consent form.  Have a friend or family member drive you to the dentist and drive you home after the procedure.  Wear comfortable, loose clothing. Limit makeup and jewelry.  Quit smoking. If you are a smoker, this will raise the chances of a healing problem after your procedure. If you are thinking about quitting, talk to your surgeon about how long before the operation  you should stop smoking. You may also get help from your primary caregiver. PROCEDURE Dental extraction is typically done as an outpatient procedure. IV sedation, local anesthesia, or both may be used. It will keep you comfortable and free of pain during the procedure.  There are 2 types of extractions:  Simple extraction involves a tooth that is visible in the mouth and above the gum line. After local anesthetic is given by injection, and the area is numbed, the dentist will loosen the tooth with a special instrument (elevator). Then another instrument (forceps) will be used to grasp the tooth and remove it from its socket. During the procedure you will feel some pressure, but you should not feel pain. If you do feel pain, tell your dentist. The open socket will be cleaned. Dressings (gauze) will be placed in the socket to reduce bleeding.  Surgical extractions are used if the tooth has not come into the mouth or the tooth is broken off below the gum line. The dentist will make a cut (incision) in the gum and may have to remove some of the bone around the tooth to aid in the removal of the tooth. After removal, stitches (sutures) may be required to close area to help in healing and control bleeding. For some surgical extractions, you may need a general anesthetic or IV sedation (through the  vein). After both types of extractions, you may be given pain medication or other drugs to help healing. Other post operative instructions will be given by your dental caregiver. AFTER THE PROCEDURE  You will have gauze in your mouth where the tooth was removed. Gentle pressure on the gauze for up to 1 hour will help to control bleeding.  A blood clot will begin to form over the open socket. This is normal. Do not touch the area or rinse it.  Your pain will be controlled with medication and self-care.  You will be given detailed instructions for care after surgery. PROGNOSIS While some discomfort is normal  after tooth extraction, most patients recover fully in just a few days. SEEK IMMEDIATE DENTAL CARE  You have uncontrolled bleeding, marked swelling, or severe pain.  You develop a fever, difficulty swallowing, or other severe symptoms.  You have questions or concerns. Document Released: 04/24/2005 Document Revised: 07/17/2011 Document Reviewed: 07/29/2010 [facility] Patient Information 2014 [place], [place].

## 2013-08-12 NOTE — ED Provider Notes (Signed)
Medical screening examination/treatment/procedure(s) were performed by non-physician practitioner and as supervising physician I was immediately available for consultation/collaboration.   Keilen Kahl T Dossie Ocanas, MD 08/12/13 1727 

## 2014-05-01 ENCOUNTER — Emergency Department (HOSPITAL_COMMUNITY)
Admission: EM | Admit: 2014-05-01 | Discharge: 2014-05-01 | Disposition: A | Discharge status: Home / Self Care | Payer: No Typology Code available for payment source | Attending: Emergency Medicine | Admitting: Emergency Medicine

## 2014-05-01 ENCOUNTER — Encounter (HOSPITAL_COMMUNITY): Payer: Self-pay | Admitting: Oncology

## 2014-05-01 DIAGNOSIS — M791 Myalgia, unspecified site: Secondary | ICD-10-CM

## 2014-05-01 DIAGNOSIS — M79603 Pain in arm, unspecified: Secondary | ICD-10-CM

## 2014-05-01 DIAGNOSIS — R112 Nausea with vomiting, unspecified: Secondary | ICD-10-CM

## 2014-05-01 DIAGNOSIS — Z792 Long term (current) use of antibiotics: Secondary | ICD-10-CM | POA: Insufficient documentation

## 2014-05-01 DIAGNOSIS — R1012 Left upper quadrant pain: Secondary | ICD-10-CM

## 2014-05-01 DIAGNOSIS — E876 Hypokalemia: Secondary | ICD-10-CM

## 2014-05-01 DIAGNOSIS — Z720 Tobacco use: Secondary | ICD-10-CM | POA: Insufficient documentation

## 2014-05-01 LAB — CBC WITH DIFFERENTIAL/PLATELET
Basophils Absolute: 0 K/uL (ref 0.0–0.1)
Basophils Relative: 0 % (ref 0–1)
Eosinophils Absolute: 0.2 K/uL (ref 0.0–0.7)
Eosinophils Relative: 3 % (ref 0–5)
HCT: 42.7 % (ref 39.0–52.0)
Hemoglobin: 13.4 g/dL (ref 13.0–17.0)
Lymphocytes Relative: 25 % (ref 12–46)
Lymphs Abs: 1.9 K/uL (ref 0.7–4.0)
MCH: 28.2 pg (ref 26.0–34.0)
MCHC: 31.4 g/dL (ref 30.0–36.0)
MCV: 89.7 fL (ref 78.0–100.0)
Monocytes Absolute: 0.6 K/uL (ref 0.1–1.0)
Monocytes Relative: 8 % (ref 3–12)
Neutro Abs: 4.9 K/uL (ref 1.7–7.7)
Neutrophils Relative %: 64 % (ref 43–77)
Platelets: 207 K/uL (ref 150–400)
RBC: 4.76 MIL/uL (ref 4.22–5.81)
RDW: 13.9 % (ref 11.5–15.5)
WBC: 7.6 K/uL (ref 4.0–10.5)

## 2014-05-01 LAB — COMPREHENSIVE METABOLIC PANEL WITH GFR
ALT: 16 U/L (ref 0–53)
AST: 21 U/L (ref 0–37)
Albumin: 4.2 g/dL (ref 3.5–5.2)
Alkaline Phosphatase: 83 U/L (ref 39–117)
Anion gap: 12 (ref 5–15)
BUN: 14 mg/dL (ref 6–23)
CO2: 20 mmol/L (ref 19–32)
Calcium: 9.2 mg/dL (ref 8.4–10.5)
Chloride: 105 meq/L (ref 96–112)
Creatinine, Ser: 0.83 mg/dL (ref 0.50–1.35)
GFR calc Af Amer: >90 mL/min
GFR calc non Af Amer: >90 mL/min
Glucose, Bld: 87 mg/dL (ref 70–99)
Potassium: 3.3 mmol/L — ABNORMAL LOW (ref 3.5–5.1)
Sodium: 137 mmol/L (ref 135–145)
Total Bilirubin: 0.9 mg/dL (ref 0.3–1.2)
Total Protein: 7.2 g/dL (ref 6.0–8.3)

## 2014-05-01 LAB — URINALYSIS, ROUTINE W REFLEX MICROSCOPIC
Bilirubin Urine: NEGATIVE
Glucose, UA: NEGATIVE mg/dL
HGB URINE DIPSTICK: NEGATIVE
KETONES UR: 15 mg/dL — AB
Leukocytes, UA: NEGATIVE
Nitrite: NEGATIVE
PROTEIN: NEGATIVE mg/dL
Specific Gravity, Urine: 1.024 (ref 1.005–1.030)
UROBILINOGEN UA: 1 mg/dL (ref 0.0–1.0)
pH: 6 (ref 5.0–8.0)

## 2014-05-01 LAB — LIPASE, BLOOD: Lipase: 26 U/L (ref 11–59)

## 2014-05-01 LAB — I-STAT TROPONIN, ED: Troponin i, poc: 0 ng/mL (ref 0.00–0.08)

## 2014-05-01 MED ORDER — ONDANSETRON 8 MG PO TBDP
8.0000 mg | ORAL_TABLET | Freq: Once | ORAL | Status: AC
  Administered 2014-05-01: 8 mg via ORAL
  Filled 2014-05-01: qty 1

## 2014-05-01 MED ORDER — ONDANSETRON HCL 8 MG PO TABS: 8.0000 mg | ORAL_TABLET | Freq: Three times a day (TID) | ORAL | Status: DC | PRN

## 2014-05-01 MED ORDER — POTASSIUM CHLORIDE CRYS ER 20 MEQ PO TBCR: 20.0000 meq | EXTENDED_RELEASE_TABLET | Freq: Every day | ORAL | Status: DC

## 2014-05-01 NOTE — ED Notes (Signed)
Pt presents d/t b/l pain in arms, swelling in hands and N/V/D.  Pt is concerned about being a diabetic as it runs in his family.  Pt reports emesis x 4, fatigue, abdominal pain and constant nausea.

## 2014-05-01 NOTE — ED Provider Notes (Signed)
CSN: [id number]     Arrival date & time 05/01/14  2106 History   First MD Initiated Contact with Patient 05/01/14 2133     Chief Complaint  Patient presents with  . Arm Pain    b/l worse in left than right  . Abdominal Pain     (Consider location/radiation/quality/duration/timing/severity/associated sxs/prior Treatment) HPI Comments: Evan Wiley is a 33 y.o. male with a PMHx of depression/anxiety, who presents to the ED with complaints of 2-3 weeks of bilateral arm pain and intermittent hand swelling, and one day of intermittent abdominal pain, nausea, vomiting, and diarrhea. Patient reports that he began working in [employer] a few months ago, and that over the last 2-3 weeks he has had pain in his deltoid region of both arms, left greater than right, which she describes as 8/10 sharp intermittent pain that radiates down into his hands with no known aggravating factors, and alleviated with NSAIDs and alcohol consumption. He reports that at this time this sympom has resolved. Patient states that when he is having these symptoms, he feels like his hand is stiff but there is a glove around it, and states that his hands become swollen. He has also noticed swelling in his feet, which is currently resolved. Patient also reports that he has had 8/10 sharp left upper quadrant pain that is intermittent, nonradiating, worse with laying on his left side, and unrelieved with NSAIDs, but this is also currently resolved. He reports that over the last 24 hours he has had 4 episodes of nonbloody nonbilious emesis consisting of stomach contents, but states his nausea has completely resolved at this time. He also states he has had nonbloody nonbilious non-mucoid watery diarrhea twice in the last 1 day. He denies any fevers, chills, chest pain, shortness breath, constipation, obstipation, dysuria, hematuria, penile discharge or pain, numbness, weakness, or focal neuro deficits. Denies any headache, vision  changes, lightheadedness, dizziness, sick contacts, recent travel, or antibiotics. He states he is a social drinker, and states he only drinks beers occasionally after work. He smokes daily. He is concerned with the possibility of diabetes, given that all of his family members diagnosed at a young age with diabetes.  Patient is a 33 y.o. male presenting with abdominal pain. The history is provided by the patient. No language interpreter was used.  Abdominal Pain Pain location:  LUQ Pain quality: sharp   Pain radiates to:  Does not radiate Pain severity:  Moderate (8/10) Onset quality:  Gradual Duration:  1 day Timing:  Intermittent Progression:  Resolved Chronicity:  New Context: not recent travel, not sick contacts, not suspicious food intake and not trauma   Relieved by:  None tried Worsened by:  Nothing tried Ineffective treatments:  None tried Associated symptoms: diarrhea (watery, ~2x today, nonbloody nonbilious), nausea (currently resolved) and vomiting (4 episodes today, currently resolved, nonbloody nonbilious, stomach contents)   Associated symptoms: no belching, no chest pain, no chills, no constipation, no cough, no dysuria, no fever, no flatus, no hematemesis, no hematochezia, no hematuria, no melena, no shortness of breath and no sore throat   Diarrhea:    Quality:  Watery   Number of occurrences:  2   Severity:  Mild   Duration:  1 day   Timing:  Intermittent   Progression:  Resolved Nausea:    Severity:  Mild   Onset quality:  Gradual   Duration:  1 day   Timing:  Intermittent   Progression:  Resolved Vomiting:    Quality:  Stomach contents   Number of occurrences:  4   Severity:  Mild   Duration:  1 day   Timing:  Intermittent   Progression:  Resolved Risk factors: NSAID use     History reviewed. No pertinent past medical history. History reviewed. No pertinent past surgical history. History reviewed. No pertinent family history. History  Substance Use  Topics  . Smoking status: Current Every Day Smoker    Types: Cigarettes  . Smokeless tobacco: Never Used  . Alcohol Use: Yes     Comment: occasional    Review of Systems  Constitutional: Negative for fever, chills and diaphoresis.  HENT: Negative for congestion, rhinorrhea and sore throat.   Respiratory: Negative for cough and shortness of breath.   Cardiovascular: Negative for chest pain and leg swelling.  Gastrointestinal: Positive for nausea (currently resolved), vomiting (4 episodes today, currently resolved, nonbloody nonbilious, stomach contents), abdominal pain (intermittent, currently resolved) and diarrhea (watery, ~2x today, nonbloody nonbilious). Negative for constipation, blood in stool, melena, hematochezia, rectal pain, flatus and hematemesis.  Genitourinary: Negative for dysuria, frequency, hematuria, flank pain, discharge and penile pain.  Musculoskeletal: Positive for myalgias (b/l deltoids, intermittent, currently resolved) and joint swelling (subjective hand/feet swelling). Negative for back pain, arthralgias, neck pain and neck stiffness.  Skin: Negative for color change.  Neurological: Negative for dizziness, weakness, light-headedness, numbness and headaches.       +intermittent paresthesias in hands/feet bilaterally  Psychiatric/Behavioral: Negative for confusion.   10 Systems reviewed and are negative for acute change except as noted in the HPI.    Allergies  Review of patient's allergies indicates no known allergies.  Home Medications   Prior to Admission medications   Medication Sig Start Date End Date Taking? Authorizing Provider  clindamycin (CLEOCIN) 150 MG capsule Take 1 capsule (150 mg total) by mouth every 6 (six) hours. 08/03/13   Tiffany Irine Seal, PA-C  lurasidone (LATUDA) 40 MG TABS tablet Take 40 mg by mouth daily as needed (depression/anxiety).    Historical Provider, MD  oxyCODONE-acetaminophen (PERCOCET/ROXICET) 5-325 MG per tablet Take 2 tablets  by mouth every 4 (four) hours as needed for severe pain. 08/03/13   Tiffany Irine Seal, PA-C  penicillin v potassium (VEETID) 500 MG tablet Take 1 tablet (500 mg total) by mouth 3 (three) times daily. 08/02/13   Fayrene Helper, PA-C  sertraline (ZOLOFT) 25 MG tablet Take 25 mg by mouth daily as needed (depression/anxiety).    Historical Provider, MD   BP 131/80 mmHg  Pulse 96  Temp(Src) 97.9 F (36.6 C) (Oral)  Resp 19  Ht 5\' 11"  (1.803 m)  Wt 180 lb (81.647 kg)  BMI 25.12 kg/m2  SpO2 98% Physical Exam  Constitutional: He is oriented to person, place, and time. Vital signs are normal. He appears well-developed and well-nourished.  Non-toxic appearance. No distress.  Afebrile, nontoxic, NAD  HENT:  Head: Normocephalic and atraumatic.  Mouth/Throat: Oropharynx is clear and moist and mucous membranes are normal.  Eyes: Conjunctivae and EOM are normal. Right eye exhibits no discharge. Left eye exhibits no discharge.  Neck: Normal range of motion. Neck supple.  Cardiovascular: Normal rate, regular rhythm, normal heart sounds and intact distal pulses.  Exam reveals no gallop and no friction rub.   No murmur heard. Pulmonary/Chest: Effort normal and breath sounds normal. No respiratory distress. He has no decreased breath sounds. He has no wheezes. He has no rhonchi. He has no rales.  Abdominal: Soft. Normal appearance and bowel sounds are normal. He  exhibits no distension. There is no tenderness. There is no rigidity, no rebound, no guarding, no CVA tenderness, no tenderness at McBurney's point and negative Murphy's sign.  Soft, NTND, +BS throughout, no r/g/r, neg murphy's, neg mcburney's, no CVA TTP  Musculoskeletal: Normal range of motion.  MAE x4 Strength 5/5 in all extremities Sensation grossly intact in all extremities Distal pulses intact in all extremities No visible swelling or deformity to hands or feet, no pedal edema B/L arms nonTTP without deformity, FROM intact at shoulder and wrists.  No erythema or joint swelling.   Neurological: He is alert and oriented to person, place, and time. He has normal strength. No sensory deficit.  Skin: Skin is warm, dry and intact. No rash noted.  Psychiatric: He has a normal mood and affect.  Nursing note and vitals reviewed.   ED Course  Procedures (including critical care time) Labs Review Labs Reviewed  COMPREHENSIVE METABOLIC PANEL - Abnormal; Notable for the following:    Potassium 3.3 (*)    All other components within normal limits  URINALYSIS, ROUTINE W REFLEX MICROSCOPIC - Abnormal; Notable for the following:    Ketones, ur 15 (*)    All other components within normal limits  CBC WITH DIFFERENTIAL  LIPASE, BLOOD  I-STAT TROPOININ, ED    Imaging Review No results found.   EKG Interpretation   Date/Time:  Friday May 01 2014 21:26:57 EST Ventricular Rate:  79 PR Interval:  152 QRS Duration: 92 QT Interval:  352 QTC Calculation: 403 R Axis:   71 Text Interpretation:  Sinus rhythm Consider left ventricular hypertrophy  No old tracing to compare Confirmed by KNAPP  MD-J, JON ([id number]) on  05/01/2014 9:29:43 PM      MDM   Final diagnoses:  Pain of upper extremity, unspecified laterality  Muscle pain  Left upper quadrant pain  Non-intractable vomiting with nausea, vomiting of unspecified type  Hypokalemia    33 y.o. male  DDx includes vitamin B12 deficiency vs diabetes vs other electrolyte disturbance vs scleroderma (unlikely). Difficult to discern etiology at this time. Will proceed with labs and EKG. Pt states all symptoms resolved at this time and doesn't want pain meds. Will give zofran although he states nausea is gone, and PO challenge. Will reassess shortly.  11:14 PM U/A clear, trop neg, EKG unremarkable, CBC WNL, lipase WNL, CMP showing mildly low potassium, could be source of his symptoms, discussed repletion with PO KDur x3 days. Tolerating PO fluids, all symptoms continue to be resolved.  Discussed that his symptoms could be related to vitamin deficiencies or other rare disorders but no emergent causes noted at this time. Discussed use of tylenol or motrin for pain, zofran for nausea, and kdur. Given handout of potassium content in foods. Discussed staying hydrated. Will have him f/up with [facility] or find a PCP on resource guide. I explained the diagnosis and have given explicit precautions to return to the ER including for any other new or worsening symptoms. The patient understands and accepts the medical plan as it's been dictated and I have answered their questions. Discharge instructions concerning home care and prescriptions have been given. The patient is STABLE and is discharged to home in good condition.  BP 131/80 mmHg  Pulse 96  Temp(Src) 97.9 F (36.6 C) (Oral)  Resp 19  Ht 5\' 11"  (1.803 m)  Wt 180 lb (81.647 kg)  BMI 25.12 kg/m2  SpO2 98%  Meds ordered this encounter  Medications  . ondansetron (  ZOFRAN-ODT) disintegrating tablet 8 mg    Sig:   . potassium chloride SA (K-DUR,KLOR-CON) 20 MEQ tablet    Sig: Take 1 tablet (20 mEq total) by mouth daily.    Dispense:  3 tablet    Refill:  0    Order Specific Question:  Supervising Provider    Answer:  Eber HongMILLER, BRIAN D [[id number]]  . ondansetron (ZOFRAN) 8 MG tablet    Sig: Take 1 tablet (8 mg total) by mouth every 8 (eight) hours as needed for nausea or vomiting.    Dispense:  10 tablet    Refill:  0    Order Specific Question:  Supervising Provider    Answer:  Vida RollerMILLER, BRIAN D [address][id number]]     Gianne Shugars Strupp Bryantamprubi-Soms, PA-C 05/01/14 2317  Linwood DibblesJon Knapp, MD 05/01/14 [phone]

## 2014-05-01 NOTE — Discharge Instructions (Signed)
Use zofran as prescribed, as needed for nausea. Stay well hydrated with small sips of fluids throughout the day. Follow a BRAT (banana-rice-applesauce-toast) diet as described below for the next 24-48 hours. The 'BRAT' diet is suggested, then progress to diet as tolerated as symptoms abate. Call if bloody stools, persistent diarrhea, vomiting, fever or abdominal pain. Use KDur (potassium supplement) as directed to help replenish your potassium. Avoid alcohol consumption. Eat well balanced meals and use the list below to find foods that are high in potassium. Follow up with [place] [facility], or find a primary care doctor using the list below, in 1 week for ongoing care of your chronic medical conditions. Return to ER for changing or worsening of symptoms.  Food Choices to Help Relieve Diarrhea When you have diarrhea, the foods you eat and your eating habits are very important. Choosing the right foods and drinks can help relieve diarrhea. Also, because diarrhea can last up to 7 days, you need to replace lost fluids and electrolytes (such as sodium, potassium, and chloride) in order to help prevent dehydration.  WHAT GENERAL GUIDELINES DO I NEED TO FOLLOW?  Slowly drink 1 cup (8 oz) of fluid for each episode of diarrhea. If you are getting enough fluid, your urine will be clear or pale yellow.  Eat starchy foods. Some good choices include white rice, white toast, pasta, low-fiber cereal, baked potatoes (without the skin), saltine crackers, and bagels.  Avoid large servings of any cooked vegetables.  Limit fruit to two servings per day. A serving is  cup or 1 small piece.  Choose foods with less than 2 g of fiber per serving.  Limit fats to less than 8 tsp (38 g) per day.  Avoid fried foods.  Eat foods that have probiotics in them. Probiotics can be found in certain dairy products.  Avoid foods and beverages that may increase the speed at which food moves through the stomach and  intestines (gastrointestinal tract). Things to avoid include:  High-fiber foods, such as dried fruit, raw fruits and vegetables, nuts, seeds, and whole grain foods.  Spicy foods and high-fat foods.  Foods and beverages sweetened with high-fructose corn syrup, honey, or sugar alcohols such as xylitol, sorbitol, and mannitol. WHAT FOODS ARE RECOMMENDED? Grains White rice. White, [place], or pita breads (fresh or toasted), including plain rolls, buns, or bagels. White pasta. Saltine, soda, or graham crackers. Pretzels. Low-fiber cereal. Cooked cereals made with water (such as cornmeal, farina, or cream cereals). Plain muffins. Matzo. Melba toast. Zwieback.  Vegetables Potatoes (without the skin). Strained tomato and vegetable juices. Most well-cooked and canned vegetables without seeds. Tender lettuce. Fruits Cooked or canned applesauce, apricots, cherries, fruit cocktail, grapefruit, peaches, pears, or plums. Fresh bananas, apples without skin, cherries, grapes, cantaloupe, grapefruit, peaches, oranges, or plums.  Meat and Other Protein Products Baked or boiled chicken. Eggs. Tofu. Fish. Seafood. Smooth peanut butter. Ground or well-cooked tender beef, ham, veal, lamb, pork, or poultry.  Dairy Plain yogurt, kefir, and unsweetened liquid yogurt. Lactose-free milk, buttermilk, or soy milk. Plain hard cheese. Beverages Sport drinks. Clear broths. Diluted fruit juices (except prune). Regular, caffeine-free sodas such as ginger ale. Water. Decaffeinated teas. Oral rehydration solutions. Sugar-free beverages not sweetened with sugar alcohols. Other Bouillon, broth, or soups made from recommended foods.  The items listed above may not be a complete list of recommended foods or beverages. Contact your dietitian for more options. WHAT FOODS ARE NOT RECOMMENDED? Grains Whole grain, whole wheat, bran, or rye  breads, rolls, pastas, crackers, and cereals. Wild or brown rice. Cereals that contain more than 2  g of fiber per serving. Corn tortillas or taco shells. Cooked or dry oatmeal. Granola. Popcorn. Vegetables Raw vegetables. Cabbage, broccoli, Brussels sprouts, artichokes, baked beans, beet greens, corn, kale, legumes, peas, sweet potatoes, and yams. Potato skins. Cooked spinach and cabbage. Fruits Dried fruit, including raisins and dates. Raw fruits. Stewed or dried prunes. Fresh apples with skin, apricots, mangoes, pears, raspberries, and strawberries.  Meat and Other Protein Products Chunky peanut butter. Nuts and seeds. Beans and lentils. Tomasa Blase.  Dairy High-fat cheeses. Milk, chocolate milk, and beverages made with milk, such as milk shakes. Cream. Ice cream. Sweets and Desserts Sweet rolls, doughnuts, and sweet breads. Pancakes and waffles. Fats and Oils Butter. Cream sauces. Margarine. Salad oils. Plain salad dressings. Olives. Avocados.  Beverages Caffeinated beverages (such as coffee, tea, soda, or energy drinks). Alcoholic beverages. Fruit juices with pulp. Prune juice. Soft drinks sweetened with high-fructose corn syrup or sugar alcohols. Ot  Abdominal Pain Many things can cause abdominal pain. Usually, abdominal pain is not caused by a disease and will improve without treatment. It can often be observed and treated at home. Your health care provider will do a physical exam and possibly order blood tests and X-rays to help determine the seriousness of your pain. However, in many cases, more time must pass before a clear cause of the pain can be found. Before that point, your health care provider may not know if you need more testing or further treatment. HOME CARE INSTRUCTIONS  Monitor your abdominal pain for any changes. The following actions may help to alleviate any discomfort you are experiencing:  Only take over-the-counter or prescription medicines as directed by your health care provider.  Do not take laxatives unless directed to do so by your health care provider.  Try a  clear liquid diet (broth, tea, or water) as directed by your health care provider. Slowly move to a bland diet as tolerated. SEEK MEDICAL CARE IF:  You have unexplained abdominal pain.  You have abdominal pain associated with nausea or diarrhea.  You have pain when you urinate or have a bowel movement.  You experience abdominal pain that wakes you in the night.  You have abdominal pain that is worsened or improved by eating food.  You have abdominal pain that is worsened with eating fatty foods.  You have a fever. SEEK IMMEDIATE MEDICAL CARE IF:   Your pain does not go away within 2 hours.  You keep throwing up (vomiting).  Your pain is felt only in portions of the abdomen, such as the right side or the left lower portion of the abdomen.  You pass bloody or black tarry stools. MAKE SURE YOU:  Understand these instructions.   Will watch your condition.   Will get help right away if you are not doing well or get worse.  Document Released: 02/01/2005 Document Revised: 04/29/2013 Document Reviewed: 01/01/2013 [facility] Patient Information 2015 [place], [place]. This information is not intended to replace advice given to you by your health care provider. Make sure you discuss any questions you have with your health care provider. Hypokalemia Hypokalemia means that the amount of potassium in the blood is lower than normal.Potassium is a chemical, called an electrolyte, that helps regulate the amount of fluid in the body. It also stimulates muscle contraction and helps nerves function properly.Most of the body's potassium is inside of cells, and only a very small amount  is in the blood. Because the amount in the blood is so small, minor changes can be life-threatening. CAUSES  Antibiotics.  Diarrhea or vomiting.  Using laxatives too much, which can cause diarrhea.  Chronic kidney disease.  Water pills (diuretics).  Eating disorders (bulimia).  Low magnesium  level.  Sweating a lot. SIGNS AND SYMPTOMS  Weakness.  Constipation.  Fatigue.  Muscle cramps.  Mental confusion.  Skipped heartbeats or irregular heartbeat (palpitations).  Tingling or numbness. DIAGNOSIS  Your health care provider can diagnose hypokalemia with blood tests. In addition to checking your potassium level, your health care provider may also check other lab tests. TREATMENT Hypokalemia can be treated with potassium supplements taken by mouth or adjustments in your current medicines. If your potassium level is very low, you may need to get potassium through a vein (IV) and be monitored in the hospital. A diet high in potassium is also helpful. Foods high in potassium are:  Nuts, such as peanuts and pistachios.  Seeds, such as sunflower seeds and pumpkin seeds.  Peas, lentils, and lima beans.  Whole grain and bran cereals and breads.  Fresh fruit and vegetables, such as apricots, avocado, bananas, cantaloupe, kiwi, oranges, tomatoes, asparagus, and potatoes.  Orange and tomato juices.  Red meats.  Fruit yogurt. HOME CARE INSTRUCTIONS  Take all medicines as prescribed by your health care provider.  Maintain a healthy diet by including nutritious food, such as fruits, vegetables, nuts, whole grains, and lean meats.  If you are taking a laxative, be sure to follow the directions on the label. SEEK MEDICAL CARE IF:  Your weakness gets worse.  You feel your heart pounding or racing.  You are vomiting or having diarrhea.  You are diabetic and having trouble keeping your blood glucose in the normal range. SEEK IMMEDIATE MEDICAL CARE IF:  You have chest pain, shortness of breath, or dizziness.  You are vomiting or having diarrhea for more than 2 days.  You faint. MAKE SURE YOU:   Understand these instructions.  Will watch your condition.  Will get help right away if you are not doing well or get worse. Document Released: 04/24/2005 Document  Revised: 02/12/2013 Document Reviewed: 10/25/2012 [facility] Patient Information 2015 [place], [place]. This information is not intended to replace advice given to you by your health care provider. Make sure you discuss any questions you have with your health care provider.  Nausea and Vomiting Nausea is a sick feeling that often comes before throwing up (vomiting). Vomiting is a reflex where stomach contents come out of your mouth. Vomiting can cause severe loss of body fluids (dehydration). Children and elderly adults can become dehydrated quickly, especially if they also have diarrhea. Nausea and vomiting are symptoms of a condition or disease. It is important to find the cause of your symptoms. CAUSES   Direct irritation of the stomach lining. This irritation can result from increased acid production (gastroesophageal reflux disease), infection, food poisoning, taking certain medicines (such as nonsteroidal anti-inflammatory drugs), alcohol use, or tobacco use.  Signals from the brain.These signals could be caused by a headache, heat exposure, an inner ear disturbance, increased pressure in the brain from injury, infection, a tumor, or a concussion, pain, emotional stimulus, or metabolic problems.  An obstruction in the gastrointestinal tract (bowel obstruction).  Illnesses such as diabetes, hepatitis, gallbladder problems, appendicitis, kidney problems, cancer, sepsis, atypical symptoms of a heart attack, or eating disorders.  Medical treatments such as chemotherapy and radiation.  Receiving medicine that makes  you sleep (general anesthetic) during surgery. DIAGNOSIS Your caregiver may ask for tests to be done if the problems do not improve after a few days. Tests may also be done if symptoms are severe or if the reason for the nausea and vomiting is not clear. Tests may include:  Urine tests.  Blood tests.  Stool tests.  Cultures (to look for evidence of infection).  X-rays or other  imaging studies. Test results can help your caregiver make decisions about treatment or the need for additional tests. TREATMENT You need to stay well hydrated. Drink frequently but in small amounts.You may wish to drink water, sports drinks, clear broth, or eat frozen ice pops or gelatin dessert to help stay hydrated.When you eat, eating slowly may help prevent nausea.There are also some antinausea medicines that may help prevent nausea. HOME CARE INSTRUCTIONS   Take all medicine as directed by your caregiver.  If you do not have an appetite, do not force yourself to eat. However, you must continue to drink fluids.  If you have an appetite, eat a normal diet unless your caregiver tells you differently.  Eat a variety of complex carbohydrates (rice, wheat, potatoes, bread), lean meats, yogurt, fruits, and vegetables.  Avoid high-fat foods because they are more difficult to digest.  Drink enough water and fluids to keep your urine clear or pale yellow.  If you are dehydrated, ask your caregiver for specific rehydration instructions. Signs of dehydration may include:  Severe thirst.  Dry lips and mouth.  Dizziness.  Dark urine.  Decreasing urine frequency and amount.  Confusion.  Rapid breathing or pulse. SEEK IMMEDIATE MEDICAL CARE IF:   You have blood or brown flecks (like coffee grounds) in your vomit.  You have black or bloody stools.  You have a severe headache or stiff neck.  You are confused.  You have severe abdominal pain.  You have chest pain or trouble breathing.  You do not urinate at least once every 8 hours.  You develop cold or clammy skin.  You continue to vomit for longer than 24 to 48 hours.  You have a fever. MAKE SURE YOU:   Understand these instructions.  Will watch your condition.  Will get help right away if you are not doing well or get worse. Document Released: 04/24/2005 Document Revised: 07/17/2011 Document Reviewed:  09/21/2010 [facility] Patient Information 2015 [place], [place]. This information is not intended to replace advice given to you by your health care provider. Make sure you discuss any questions you have with your health care provider.  Musculoskeletal Pain Musculoskeletal pain is muscle and boney aches and pains. These pains can occur in any part of the body. Your caregiver may treat you without knowing the cause of the pain. They may treat you if blood or urine tests, X-rays, and other tests were normal.  CAUSES There is often not a definite cause or reason for these pains. These pains may be caused by a type of germ (virus). The discomfort may also come from overuse. Overuse includes working out too hard when your body is not fit. Boney aches also come from weather changes. Bone is sensitive to atmospheric pressure changes. HOME CARE INSTRUCTIONS   Ask when your test results will be ready. Make sure you get your test results.  Only take over-the-counter or prescription medicines for pain, discomfort, or fever as directed by your caregiver. If you were given medications for your condition, do not drive, operate machinery or power tools, or sign  legal documents for 24 hours. Do not drink alcohol. Do not take sleeping pills or other medications that may interfere with treatment.  Continue all activities unless the activities cause more pain. When the pain lessens, slowly resume normal activities. Gradually increase the intensity and duration of the activities or exercise.  During periods of severe pain, bed rest may be helpful. Lay or sit in any position that is comfortable.  Putting ice on the injured area.  Put ice in a bag.  Place a towel between your skin and the bag.  Leave the ice on for 15 to 20 minutes, 3 to 4 times a day.  Follow up with your caregiver for continued problems and no reason can be found for the pain. If the pain becomes worse or does not go away, it may be necessary to  repeat tests or do additional testing. Your caregiver may need to look further for a possible cause. SEEK IMMEDIATE MEDICAL CARE IF:  You have pain that is getting worse and is not relieved by medications.  You develop chest pain that is associated with shortness or breath, sweating, feeling sick to your stomach (nauseous), or throw up (vomit).  Your pain becomes localized to the abdomen.  You develop any new symptoms that seem different or that concern you. MAKE SURE YOU:   Understand these instructions.  Will watch your condition.  Will get help right away if you are not doing well or get worse. Document Released: 04/24/2005 Document Revised: 07/17/2011 Document Reviewed: 12/27/2012 [facility] [place] [facility] Patient Information 2015 [place], [place]. This information is not intended to replace advice given to you by your health care provider. Make sure you discuss any questions you have with your health care provider. her Coconut. Hot sauce. Chili powder. Mayonnaise. Gravy. Cream-based or milk-based soups.  The items listed above may not be a complete list of foods and beverages to avoid. Contact your dietitian for more information. WHAT SHOULD I DO IF I BECOME DEHYDRATED? Diarrhea can sometimes lead to dehydration. Signs of dehydration include dark urine and dry mouth and skin. If you think you are dehydrated, you should rehydrate with an oral rehydration solution. These solutions can be purchased at pharmacies, retail stores, or online.  Drink -1 cup (120-240 mL) of oral rehydration solution each time you have an episode of diarrhea. If drinking this amount makes your diarrhea worse, try drinking smaller amounts more often. For example, drink 1-3 tsp (5-15 mL) every 5-10 minutes.  A general rule for staying hydrated is to drink 1-2 L of fluid per day. Talk to your health care provider about the specific amount you should be drinking each day. Drink enough fluids to keep your urine clear or pale  yellow. Document Released: 07/15/2003 Document Revised: 04/29/2013 Document Reviewed: 03/17/2013 [facility] Patient Information 2015 [place], [place]. This information is not intended to replace advice given to you by your health care provider. Make sure you discuss any questions you have with your health care provider.  Emergency Department Resource Guide 1) Find a Doctor and Pay Out of Pocket Although you won't have to find out who is covered by your insurance plan, it is a good idea to ask around and get recommendations. You will then need to call the office and see if the doctor you have chosen will accept you as a new patient and what types of options they offer for patients who are self-pay. Some doctors offer discounts or will set up payment plans for their patients who do  not have insurance, but you will need to ask so you aren't surprised when you get to your appointment.  2) Contact Your Local Health Department Not all health departments have doctors that can see patients for sick visits, but many do, so it is worth a call to see if yours does. If you don't know where your local health department is, you can check in your phone book. The CDC also has a tool to help you locate your state's health department, and many state websites also have listings of all of their local health departments.  3) Find a Walk-in Clinic If your illness is not likely to be very severe or complicated, you may want to try a walk in clinic. These are popping up all over the country in pharmacies, drugstores, and shopping centers. They're usually staffed by nurse practitioners or physician assistants that have been trained to treat common illnesses and complaints. They're usually fairly quick and inexpensive. However, if you have serious medical issues or chronic medical problems, these are probably not your best option.  No Primary Care Doctor: - Call [facility] at  [phone] - they can help you locate a primary care  doctor that  accepts your insurance, provides certain services, etc. - Physician Referral Service- [phone]  Chronic Pain Problems: Organization         Address  Phone   Notes  [facility]  [phone] Patients need to be referred by their primary care doctor.   Medication Assistance: Organization         Address  Phone   Notes  [facility] Medication [facility] [address] [place] [place], [place] [address] [phone] --Must be a resident of [facility] -- Must have NO insurance coverage whatsoever (no Medicaid/ Medicare, etc.) -- The pt. MUST have a primary care doctor that directs their care regularly and follows them in the community   [facility]  [phone]   [employer]  [phone]    Agencies that provide inexpensive medical care: Organization         Address  Phone   Notes  [facility]  [phone]   [facility]    [phone]   [facility] [address] [place], [place] [address] [phone]   [facility] [address] [place] [place] [phone]   [facility]    [phone]   [facility]    [phone]   [facility]  [address], [place] Phone:  [phone], Fax:  [phone] Hours of Operation:  9 am - 6 pm, M-F.  Also accepts Medicaid/Medicare and self-pay.  [facility]  [address], [place] Phone: [phone], Fax: [phone]. Hours of Operation:  8:30 am - 5:30 pm, M-F.  Also accepts Medicaid and self-pay.  [facility] [place] [address] [place] Phone: [phone]   [facility] [address] Natasha Bence [place], [place] [phone], Ext. [phone] Mondays & Thursdays: 7-9 AM.  First 15 patients are seen on a first come, first serve basis.    Medicaid-accepting [facility]  Providers:  Organization         Address  Phone   Notes  [facility] [address],  [phone] Also accepts self-pay patients.  [facility] [phone]  [address] Laurell Josephs [place], [place]  [phone]   [facility] [address], [place] [phone]   [facility] [address], [place] [phone]   Renaye Rakers [address], [place]   [phone] Only accepts [place] patients after they have their name applied to their card.   Self-Pay (no insurance) in [facility]:  Organization         Address  Phone   Notes  Sickle Cell Patients, [facility] [address] [place], [place] [phone]   [facility] [address] [place], [place] [phone]   [facility] [place]  [address],  [phone]   [facility]/Dr. Osei-Bonsu  [address], [place] or [address], [place] [phone] Phone number for both [place] and [place] locations is the same.  [facility] [address] [place] [phone]   [facility] [address] [place] or [address] [phone]   [facility] [address] [place] [phone], phone; [phone], fax Sees patients 1st and 3rd Saturday of every month.  Must not qualify for public or private insurance (i.e. Medicaid, Medicare, [place] Health Choice, Veterans' Benefits)  Household income should be no more than 200% of the poverty level The clinic cannot treat you if you are pregnant or think you are pregnant  Sexually transmitted diseases are not treated at the clinic.

## 2014-05-01 NOTE — ED Notes (Signed)
Patient currently attempting to use urinal

## 2014-12-17 ENCOUNTER — Emergency Department (HOSPITAL_COMMUNITY)
Admission: EM | Admit: 2014-12-17 | Discharge: 2014-12-17 | Disposition: A | Discharge status: Home / Self Care | Payer: Self-pay | Attending: Emergency Medicine | Admitting: Emergency Medicine

## 2014-12-17 ENCOUNTER — Encounter (HOSPITAL_COMMUNITY): Payer: Self-pay

## 2014-12-17 DIAGNOSIS — IMO0002 Reserved for concepts with insufficient information to code with codable children: Secondary | ICD-10-CM

## 2014-12-17 DIAGNOSIS — L02211 Cutaneous abscess of abdominal wall: Secondary | ICD-10-CM | POA: Insufficient documentation

## 2014-12-17 DIAGNOSIS — Z791 Long term (current) use of non-steroidal anti-inflammatories (NSAID): Secondary | ICD-10-CM | POA: Insufficient documentation

## 2014-12-17 DIAGNOSIS — Z720 Tobacco use: Secondary | ICD-10-CM | POA: Insufficient documentation

## 2014-12-17 MED ORDER — DOXYCYCLINE HYCLATE 100 MG PO CAPS: 100.0000 mg | ORAL_CAPSULE | Freq: Two times a day (BID) | ORAL | Status: DC

## 2014-12-17 MED ORDER — DOXYCYCLINE HYCLATE 100 MG PO TABS
100.0000 mg | ORAL_TABLET | Freq: Once | ORAL | Status: AC
  Administered 2014-12-17: 100 mg via ORAL
  Filled 2014-12-17: qty 1

## 2014-12-17 NOTE — Discharge Instructions (Signed)
Abscess °An abscess (boil or furuncle) is an infected area on or under the skin. This area is filled with yellowish-white fluid (pus) and other material (debris). °HOME CARE  °· Only take medicines as told by your doctor. °· If you were given antibiotic medicine, take it as directed. Finish the medicine even if you start to feel better. °· If gauze is used, follow your doctor's directions for changing the gauze. °· To avoid spreading the infection: °¨ Keep your abscess covered with a bandage. °¨ Wash your hands well. °¨ Do not share personal care items, towels, or whirlpools with others. °¨ Avoid skin contact with others. °· Keep your skin and clothes clean around the abscess. °· Keep all doctor visits as told. °GET HELP RIGHT AWAY IF:  °· You have more pain, puffiness (swelling), or redness in the wound site. °· You have more fluid or blood coming from the wound site. °· You have muscle aches, chills, or you feel sick. °· You have a fever. °MAKE SURE YOU:  °· Understand these instructions. °· Will watch your condition. °· Will get help right away if you are not doing well or get worse. °Document Released: 10/11/2007 Document Revised: 10/24/2011 Document Reviewed: 07/07/2011 °ExitCare® Patient Information ©2015 [employer]. This information is not intended to replace advice given to you by your health care provider. Make sure you discuss any questions you have with your health care provider. ° °Emergency Department Resource Guide °1) Find a Doctor and Pay Out of Pocket °Although you won't have to find out who is covered by your insurance plan, it is a good idea to ask around and get recommendations. You will then need to call the office and see if the doctor you have chosen will accept you as a new patient and what types of options they offer for patients who are self-pay. Some doctors offer discounts or will set up payment plans for their patients who do not have insurance, but you will need to ask so you aren't  surprised when you get to your appointment. ° °2) Contact Your Local Health Department °Not all health departments have doctors that can see patients for sick visits, but many do, so it is worth a call to see if yours does. If you don't know where your local health department is, you can check in your phone book. The CDC also has a tool to help you locate your state's health department, and many state websites also have listings of all of their local health departments. ° °3) Find a Walk-in Clinic °If your illness is not likely to be very severe or complicated, you may want to try a walk in clinic. These are popping up all over the country in pharmacies, drugstores, and shopping centers. They're usually staffed by nurse practitioners or physician assistants that have been trained to treat common illnesses and complaints. They're usually fairly quick and inexpensive. However, if you have serious medical issues or chronic medical problems, these are probably not your best option. ° °No Primary Care Doctor: °- Call [facility] at  832-8000 - they can help you locate a primary care doctor that  accepts your insurance, provides certain services, etc. °- Physician Referral Service- 1-800-533-3463 ° °Chronic Pain Problems: °Organization         Address  Phone   Notes  °[facility]  (336) 297-2271 Patients need to be referred by their primary care doctor.  ° °Medication Assistance: °Organization           Address  Phone   Notes  °[place] [facility] [address] °[place], [place] [address] (336) 641-8030 --Must be a resident of [place] °-- Must have NO insurance coverage whatsoever (no Medicaid/ Medicare, etc.) °-- The pt. MUST have a primary care doctor that directs their care regularly and follows them in the community °  °[facility]  (866) 331-1348   °[employer]  (888) 892-1162   ° °Agencies that provide inexpensive medical care: °Organization          Address  Phone   Notes  °[facility]  (336) 832-8035   °[facility]    (336) 832-7272   °[facility] [address] °[place], [place] [address] (336) 832-4777   °[facility] [address], °[place] (336) 271-4999   °[facility]    (336) 373-0678   °[facility]    (336) 272-1050   °[facility] ° [address], [place] Phone:  (336) 832-4444, Fax:  (336) 832-4440 Hours of Operation:  9 am - 6 pm, M-F.  Also accepts Medicaid/Medicare and self-pay.  °[facility] ° [address], [place] Phone: (336) 832-3150, Fax: (336) 832-3151. Hours of Operation:  8:30 am - 5:30 pm, M-F.  Also accepts Medicaid and self-pay.  °[facility] [place] [address], [place] Phone: (336) 878-6027   °[facility] [address], [place], [place] (336)723-1848, Ext. 123 Mondays & Thursdays: 7-9 AM.  First 15 patients are seen on a first come, first serve basis. °  ° °Medicaid-accepting [place] Providers: ° °Organization         Address  Phone   Notes  °[facility] [address], [place] (336) 641-2100 Also accepts self-pay patients.  °[facility] [address], [place] ° (336) 856-9996   °[facility] [address], [place] (336) 288-8857   °[facility] [address] [place] [address], [place] (336) 299-7000   °Veita Bland [address], [place]  ° (336) 373-1557 Only accepts [place] Access Medicaid patients after they have their name applied to their card.  ° °Self-Pay (no insurance) in [place]: ° °Organization         Address  Phone   Notes  °Sickle Cell Patients, [facility] [address], [place] (336) 832-1970   °[facility] [address], [place] (336) 832-4400     °[facility] [place] ° [address],  (336) 992-4800   °[facility]/Dr. Osei-Bonsu ° [address] [place] [address], [place] or [address], [place] (336) [phone] Phone number for both [place] and [place] locations is the same.  °[facility] [address], [place] (336) 299-0000   °[facility] [address] [place] [address], [place] or [address] (336) 852-7530 °(336) 878-2260   °[facility] [address],  (336) 350-1642, phone; (336) 294-5005, fax Sees patients 1st and 3rd Saturday of every month.  Must not qualify for public or private insurance (i.e. Medicaid, Medicare,  Health Choice, Veterans' Benefits) • Household income should be no more than 200% of the poverty level •The clinic cannot treat you if you are pregnant or think   you are pregnant • Sexually transmitted diseases are not treated at the clinic.  ° ° °Dental Care: °Organization         Address  Phone  Notes  °[facility] [address], [place] (336) 641-6152 Accepts children up to age 21 who are enrolled in Medicaid or [place] Health Choice; pregnant women with a Medicaid card; and children who have applied for Medicaid or [place] Health Choice, but were declined, whose parents can pay a reduced fee at time of service.  °[facility] [place]  [address], [place] (336) 641-7733 Accepts children up to age 21 who are enrolled in Medicaid or [place] Health Choice; pregnant women with a Medicaid card; and children who have applied for Medicaid or [place] Health Choice, but were declined, whose parents can pay a reduced fee at time of service.  °[facility] ° [address], [place] (336) 641-4533 Patients are seen by appointment only. Walk-ins are not accepted. [facility] will see patients 18  years of age and older. °Monday - Tuesday (8am-5pm) °Most Wednesdays (8:30-5pm) °$30 per visit, cash only  °[facility] ° [address], [place] (336) 641-4533 Patients are seen by appointment only. Walk-ins are not accepted. [facility] will see patients 18 years of age and older. °One Wednesday Evening (Monthly: Volunteer Based).  $30 per visit, cash only  °[facility]  (919) 537-3737 for adults; Children under age 4, call Graduate Pediatric Dentistry at ([phone]) [phone]. Children aged 4-14, please call (919) [phone] to request a pediatric application. ° Dental services are provided in all areas of dental care including fillings, crowns and bridges, complete and partial dentures, implants, gum treatment, root canals, and extractions. Preventive care is also provided. Treatment is provided to both adults and children. °Patients are selected via a lottery and there is often a waiting list. °  °[facility] [address], °[place] ° (336) 763-8833 [email or web address] °  °[facility] [address], [place], [place] (336)723-1848, Ext. 123 Second and Fourth Thursday of each month, opens at 6:30 AM; Clinic ends at 9 AM.  Patients are seen on a first-come first-served basis, and a limited number are seen during each clinic.  ° °[facility] ° [address], [place], [place] (336) 723-7904   Eligibility Requirements °You must have lived in [place], [place], or [place] counties for at least the last three months. °  You cannot be eligible for state or federal sponsored healthcare insurance, including Veterans Administration, Medicaid, or Medicare. °  You generally cannot be eligible for healthcare insurance through your employer.  °  How to apply: °Eligibility screenings are held every Tuesday and Wednesday afternoon from 1:00 pm until 4:00 pm. You do not need an appointment for the interview!  °[facility]  [address], [place], [place] 336-631-2330   °[facility]  336-342-8273   °[facility]  336-703-3100   °[facility]  336-570-6415   ° °Behavioral Health Resources in the Community: °Intensive Outpatient Programs °Organization         Address  Phone  Notes  °[facility] [address], [place], [place] 336-878-6098   °[facility] [address], [place],  336-832-9800   °[facility] [address], [place], [place] ° 336-882-2125   °[facility] [address],  °[place], [place] 1-800-853-5163 or 336-641-4981   °Substance Abuse Resources °Organization         Address  Phone  Notes  °[facility]  336-882-2125   °[facility]  336-784-9470   °[facility]  336-285-9073   °[facility]  336-845-3988   °Residential & Outpatient Substance Abuse Program  1-800-659-3381   °Psychological Services °Organization         Address  Phone  Notes  °[facility]  336- 832-9600   °[facility]  336- 378-7881   °[facility] [address], [place] 1-800-853-5163 or 336-641-4981   ° °Mobile Crisis Teams °Organization         Address  Phone  Notes  °[facility], Mobile Crisis Care Unit  1-877-626-1772   °Assertive °[facility] ° [address] [place], [place] 336-834-9664   °Sharon DeEsch [address] °[place] 336-554-5454   ° °Self-Help/Support Groups °Organization         Address  Phone             Notes  °[facility] - variety of support groups  336- 373-1402 Call for more information  °Narcotics Anonymous (NA), [facility] [address], °[place]  2 meetings at this location  ° °Residential Treatment Programs °Organization         Address  Phone  Notes  °[facility] [address],    °[place]   1-866-801-8205   °[facility] ° [address], [place], [place] 704-293-8524   °[facility] [address], [place] 336-845-3988 Admissions: 8am-3pm M-F  °[facility] [address]    °[place], [place] 336-841-1104   °[facility] [address], [place], [place] 336-379-7146   °[facility] [address]  °[place], [place] 336-285-9073   °[facility] - Intensive Outpatient [address], [place], [place] 336-852-3033   °[facility] [address]  °[place], [place] 1-877-615-2722 or 336-784-9470   °[facility] [address] [place], [place] 336-227-7417 Accepts Medicaid  °[facility] [address]  ° [place] 1-800-659-3381 Substance Abuse/Addiction Treatment  ° °[place] Behavioral Health Resources °Organization         Address  Phone  Notes  °[facility]  (888) 581-9988   °Julie Brannon, PhD [address] [place], [place]   (336) 349-5553 or (336) 951-0000   °[facility]   [address] °[place], [place] (336) 349-4454   °[facility] [address], [place], [place] (336) 342-8316 Insurance/Medicaid/sponsorship through [facility]  °[facility] [address]                                    [place], [place] (336) 342-8316 Therapy/tele-psych/case  °[facility] [address]  ° [place],  (336) 349-2233    °Dr. Arfeen  (336) 349-4544   °[facility] [place]  [employer] [place] [facility] 1) [address], [place] °2) [address], [place] °3)  [address], [place] (336) 349-3220 °(336) 342-7768 ° °(336) 342-8140   °[place] [facility] (  336) 342-1394 or (336) 342-3537 (After Hours)    ° ° °

## 2014-12-17 NOTE — ED Provider Notes (Signed)
CSN: [id number]     Arrival date & time 12/17/14  0522 History   First MD Initiated Contact with Patient 12/17/14 0555     Chief Complaint  Patient presents with  . Recurrent Skin Infections     (Consider location/radiation/quality/duration/timing/severity/associated sxs/prior Treatment) The history is provided by the patient. No language interpreter was used.  Evan Wiley is a 34 year old male who presents for an abscess on the right side of the abdomen that began 2 weeks ago. He states it has gotten much better and does not want it drained.  He states he only wants it checked. He has had this once before in the past on the lower pelvis after shaving. He denies any prior treatment. Nothing makes his symptoms better or worse. He denies any fever, chills. History reviewed. No pertinent past medical history. History reviewed. No pertinent past surgical history. History reviewed. No pertinent family history. Social History  Substance Use Topics  . Smoking status: Current Every Day Smoker    Types: Cigarettes  . Smokeless tobacco: Never Used  . Alcohol Use: Yes     Comment: occasional    Review of Systems  Constitutional: Negative for fever and chills.  Gastrointestinal: Negative for nausea and vomiting.  Skin: Positive for wound.      Allergies  Review of patient's allergies indicates no known allergies.  Home Medications   Prior to Admission medications   Medication Sig Start Date End Date Taking? Authorizing Provider  naproxen sodium (ANAPROX) 220 MG tablet Take 220 mg by mouth 2 (two) times daily with a meal.   Yes Historical Provider, MD  clindamycin (CLEOCIN) 150 MG capsule Take 1 capsule (150 mg total) by mouth every 6 (six) hours. Patient not taking: Reported on 05/01/2014 08/03/13   Marlon Pel, PA-C  doxycycline (VIBRAMYCIN) 100 MG capsule Take 1 capsule (100 mg total) by mouth 2 (two) times daily. 12/17/14   Brentin Shin Patel-Mills, PA-C  ondansetron (ZOFRAN) 8 MG tablet  Take 1 tablet (8 mg total) by mouth every 8 (eight) hours as needed for nausea or vomiting. Patient not taking: Reported on 12/17/2014 05/01/14   Mercedes Camprubi-Soms, PA-C  oxyCODONE-acetaminophen (PERCOCET/ROXICET) 5-325 MG per tablet Take 2 tablets by mouth every 4 (four) hours as needed for severe pain. Patient not taking: Reported on 05/01/2014 08/03/13   Marlon Pel, PA-C  penicillin v potassium (VEETID) 500 MG tablet Take 1 tablet (500 mg total) by mouth 3 (three) times daily. Patient not taking: Reported on 05/01/2014 08/02/13   Fayrene Helper, PA-C  potassium chloride SA (K-DUR,KLOR-CON) 20 MEQ tablet Take 1 tablet (20 mEq total) by mouth daily. Patient not taking: Reported on 12/17/2014 05/01/14   Mercedes Camprubi-Soms, PA-C   BP 140/88 mmHg  Pulse 74  Temp(Src) 98.4 F (36.9 C) (Oral)  Resp 18  SpO2 98% Physical Exam  Constitutional: He is oriented to person, place, and time. He appears well-developed and well-nourished.  HENT:  Head: Normocephalic and atraumatic.  Eyes: Conjunctivae are normal.  Neck: Neck supple.  Cardiovascular: Normal rate.   Pulmonary/Chest: Effort normal. No respiratory distress.  Abdominal: Soft.  Musculoskeletal: Normal range of motion.  Neurological: He is alert and oriented to person, place, and time.  Skin: Skin is warm.  3 cm in diameter right indurated abdominal abscess with active bloody and mucopurulant drainage. No surrounding erythema or streaking. No fluctuance.  Psychiatric: He has a normal mood and affect. His behavior is normal.  Nursing note and vitals reviewed.   ED Course  Procedures (including critical care time) Labs Review Labs Reviewed - No data to display  Imaging Review No results found.   EKG Interpretation None      MDM   Final diagnoses:  Abscess, abdomen   Patient presents for abdominal abscess. His vitals are stable. Patient refused I&D. I thoroughly discussed that he would need this in order to avoid  infection and possible complications. Patient stated that he just wanted antibiotics. I discussed return precautions. I also discussed applying warm compresses to the area several times daily. Patient was placed on doxycycline. Medications  doxycycline (VIBRA-TABS) tablet 100 mg (100 mg Oral Given 12/17/14 0635)       Catha Gosselin, PA-C 12/17/14 1006  Devoria Albe, MD 01/18/15 1513

## 2014-12-17 NOTE — ED Notes (Signed)
Pt has an abcess on his right lower abdomen for two weeks

## 2014-12-22 ENCOUNTER — Emergency Department (HOSPITAL_COMMUNITY)
Admission: EM | Admit: 2014-12-22 | Discharge: 2014-12-22 | Disposition: A | Discharge status: Home / Self Care | Payer: Self-pay | Attending: Emergency Medicine | Admitting: Emergency Medicine

## 2014-12-22 ENCOUNTER — Encounter (HOSPITAL_COMMUNITY): Payer: Self-pay | Admitting: Emergency Medicine

## 2014-12-22 DIAGNOSIS — Z720 Tobacco use: Secondary | ICD-10-CM | POA: Insufficient documentation

## 2014-12-22 DIAGNOSIS — Z792 Long term (current) use of antibiotics: Secondary | ICD-10-CM | POA: Insufficient documentation

## 2014-12-22 DIAGNOSIS — Z791 Long term (current) use of non-steroidal anti-inflammatories (NSAID): Secondary | ICD-10-CM | POA: Insufficient documentation

## 2014-12-22 DIAGNOSIS — L02211 Cutaneous abscess of abdominal wall: Secondary | ICD-10-CM

## 2014-12-22 MED ORDER — LIDOCAINE-EPINEPHRINE 2 %-1:100000 IJ SOLN
20.0000 mL | Freq: Once | INTRAMUSCULAR | Status: AC
  Administered 2014-12-22: 20 mL via INTRADERMAL
  Filled 2014-12-22: qty 1

## 2014-12-22 MED ORDER — SULFAMETHOXAZOLE-TRIMETHOPRIM 800-160 MG PO TABS: 1.0000 | ORAL_TABLET | Freq: Two times a day (BID) | ORAL | Status: AC

## 2014-12-22 MED ORDER — IBUPROFEN 800 MG PO TABS
800.0000 mg | ORAL_TABLET | Freq: Once | ORAL | Status: AC
  Administered 2014-12-22: 800 mg via ORAL
  Filled 2014-12-22: qty 1

## 2014-12-22 MED ORDER — IBUPROFEN 600 MG PO TABS: 600.0000 mg | ORAL_TABLET | Freq: Four times a day (QID) | ORAL | Status: DC | PRN

## 2014-12-22 MED ORDER — LIDOCAINE-EPINEPHRINE (PF) 2 %-1:200000 IJ SOLN
10.0000 mL | Freq: Once | INTRAMUSCULAR | Status: DC
Start: 2014-12-22 — End: 2014-12-22

## 2014-12-22 NOTE — ED Provider Notes (Signed)
CSN: [id number]     Arrival date & time 12/22/14  9629 History   First MD Initiated Contact with Patient 12/22/14 1003     Chief Complaint  Patient presents with  . Abscess     (Consider location/radiation/quality/duration/timing/severity/associated sxs/prior Treatment) HPI Evan Wiley is a 34 y.o. male with no medical problems presents to emergency department with complaint of an abscess to his lower abdominal wall. Patient states abscess started a week ago. He was seen in emergency department and started on doxycycline. Patient states he is taking doxycycline, states abscess is getting smaller. He states that it was draining initially, but now it is not.He states he is still having severe pain in that area. He denies any fever or chills. States he does have some nausea because of pain. Patient denies any other complaints.   History reviewed. No pertinent past medical history. History reviewed. No pertinent past surgical history. History reviewed. No pertinent family history. Social History  Substance Use Topics  . Smoking status: Current Every Day Smoker    Types: Cigarettes  . Smokeless tobacco: Never Used  . Alcohol Use: Yes     Comment: occasional    Review of Systems  Constitutional: Negative for fever and chills.  Musculoskeletal: Negative for myalgias and arthralgias.  Skin: Positive for color change and wound.  Neurological: Negative for headaches.      Allergies  Review of patient's allergies indicates no known allergies.  Home Medications   Prior to Admission medications   Medication Sig Start Date End Date Taking? Authorizing Provider  clindamycin (CLEOCIN) 150 MG capsule Take 1 capsule (150 mg total) by mouth every 6 (six) hours. Patient not taking: Reported on 05/01/2014 08/03/13   Marlon Pel, PA-C  doxycycline (VIBRAMYCIN) 100 MG capsule Take 1 capsule (100 mg total) by mouth 2 (two) times daily. 12/17/14   Hanna Patel-Mills, PA-C  naproxen  sodium (ANAPROX) 220 MG tablet Take 220 mg by mouth 2 (two) times daily with a meal.    Historical Provider, MD  ondansetron (ZOFRAN) 8 MG tablet Take 1 tablet (8 mg total) by mouth every 8 (eight) hours as needed for nausea or vomiting. Patient not taking: Reported on 12/17/2014 05/01/14   Mercedes Camprubi-Soms, PA-C  oxyCODONE-acetaminophen (PERCOCET/ROXICET) 5-325 MG per tablet Take 2 tablets by mouth every 4 (four) hours as needed for severe pain. Patient not taking: Reported on 05/01/2014 08/03/13   Marlon Pel, PA-C  penicillin v potassium (VEETID) 500 MG tablet Take 1 tablet (500 mg total) by mouth 3 (three) times daily. Patient not taking: Reported on 05/01/2014 08/02/13   Fayrene Helper, PA-C  potassium chloride SA (K-DUR,KLOR-CON) 20 MEQ tablet Take 1 tablet (20 mEq total) by mouth daily. Patient not taking: Reported on 12/17/2014 05/01/14   Mercedes Camprubi-Soms, PA-C   BP 112/75 mmHg  Pulse 73  Temp(Src) 98.6 F (37 C) (Oral)  Resp 16  SpO2 98% Physical Exam  Constitutional: He is oriented to person, place, and time. He appears well-developed and well-nourished. No distress.  HENT:  Head: Normocephalic.  Eyes: Conjunctivae are normal.  Neck: Neck supple.  Cardiovascular: Normal rate, regular rhythm and normal heart sounds.   Pulmonary/Chest: Effort normal and breath sounds normal. No respiratory distress. He has no wheezes. He has no rales.  Neurological: He is alert and oriented to person, place, and time.  Skin: Skin is warm and dry.  Small, 2 x 2 centimeter abscess to the right lower abdominal wall. There is no surrounding cellulitis. Abscess  is firm, indurated, fluctuant. No drainage. Tender to palpation.  Vitals reviewed.   ED Course  Procedures (including critical care time) Labs Review Labs Reviewed - No data to display  Imaging Review No results found. Estevan Oaks A, personally reviewed and evaluated these images and lab results as part of my medical  decision-making.   EKG Interpretation None     INCISION AND DRAINAGE Performed by: Jaynie Crumble A Consent: Verbal consent obtained. Risks and benefits: risks, benefits and alternatives were discussed Type: abscess  Body area:  Right lower abdominal wall  Anesthesia: local infiltration  Incision was made with a scalpel.  Local anesthetic: lidocaine 2% w epinephrine  Anesthetic total: 3 ml  Complexity: complex Blunt dissection to break up loculations  Drainage: purulent  Drainage amount: small  Packing material: none  Patient tolerance: Patient tolerated the procedure well with no immediate complications.    MDM   Final diagnoses:  Abscess of abdominal wall    Patient with lower abdominal wall abscess, has been on antibiotics for a week, it is improving. It is indurated and fluctuant, incised and drained with only small purulent drainage. Will add Bactrim, warm compresses and soaks at home, follow-up as needed. At this time patient is non-toxic-appearing. Vital signs are normal. Filed Vitals:   12/22/14 0934 12/22/14 1011 12/22/14 1119  BP: 112/15 112/75   Pulse: 73  66  Temp: 98.6 F (37 C)    TempSrc: Oral    Resp: 16    SpO2: 98%  100%       Jaynie Crumble, PA-C 12/22/14 1259  Raeford Razor, MD 12/24/14 [id number]

## 2014-12-22 NOTE — ED Notes (Signed)
Pt has been having increased pain from an abscess that he has had for several weeks. Pt was seen in ED on 8/11 for same. Pt had vomiting today and unable to take his ABT. Pt is willing to have the abscess opened today if needed. Abscess is on R lower abd.

## 2014-12-22 NOTE — Discharge Instructions (Signed)
Warm soaks or warm compresses multiple times a day. Bactrim as prescribed for infection. Ibuprofen for pain. Follow up as needed.    Abscess Care After An abscess (also called a boil or furuncle) is an infected area that contains a collection of pus. Signs and symptoms of an abscess include pain, tenderness, redness, or hardness, or you may feel a moveable soft area under your skin. An abscess can occur anywhere in the body. The infection may spread to surrounding tissues causing cellulitis. A cut (incision) by the surgeon was made over your abscess and the pus was drained out. Gauze may have been packed into the space to provide a drain that will allow the cavity to heal from the inside outwards. The boil may be painful for 5 to 7 days. Most people with a boil do not have high fevers. Your abscess, if seen early, may not have localized, and may not have been lanced. If not, another appointment may be required for this if it does not get better on its own or with medications. HOME CARE INSTRUCTIONS   Only take over-the-counter or prescription medicines for pain, discomfort, or fever as directed by your caregiver.  When you bathe, soak and then remove gauze or iodoform packs at least daily or as directed by your caregiver. You may then wash the wound gently with mild soapy water. Repack with gauze or do as your caregiver directs. SEEK IMMEDIATE MEDICAL CARE IF:   You develop increased pain, swelling, redness, drainage, or bleeding in the wound site.  You develop signs of generalized infection including muscle aches, chills, fever, or a general ill feeling.  An oral temperature above 102 F (38.9 C) develops, not controlled by medication. See your caregiver for a recheck if you develop any of the symptoms described above. If medications (antibiotics) were prescribed, take them as directed. Document Released: 11/10/2004 Document Revised: 07/17/2011 Document Reviewed: 07/08/2007 [facility] Patient  Information 2015 [place], [place]. This information is not intended to replace advice given to you by your health care provider. Make sure you discuss any questions you have with your health care provider.

## 2015-05-07 ENCOUNTER — Encounter (HOSPITAL_COMMUNITY): Payer: Self-pay | Admitting: Emergency Medicine

## 2015-05-07 ENCOUNTER — Emergency Department (HOSPITAL_COMMUNITY)
Admission: EM | Admit: 2015-05-07 | Discharge: 2015-05-07 | Disposition: A | Discharge status: Home / Self Care | Payer: Self-pay | Attending: Emergency Medicine | Admitting: Emergency Medicine

## 2015-05-07 DIAGNOSIS — N342 Other urethritis: Secondary | ICD-10-CM | POA: Insufficient documentation

## 2015-05-07 DIAGNOSIS — F17210 Nicotine dependence, cigarettes, uncomplicated: Secondary | ICD-10-CM | POA: Insufficient documentation

## 2015-05-07 LAB — URINALYSIS, ROUTINE W REFLEX MICROSCOPIC
Bilirubin Urine: NEGATIVE
Glucose, UA: NEGATIVE mg/dL
Hgb urine dipstick: NEGATIVE
Ketones, ur: NEGATIVE mg/dL
Leukocytes, UA: NEGATIVE
Nitrite: NEGATIVE
Protein, ur: NEGATIVE mg/dL
Specific Gravity, Urine: 1.02 (ref 1.005–1.030)
pH: 6.5 (ref 5.0–8.0)

## 2015-05-07 MED ORDER — AZITHROMYCIN 250 MG PO TABS
1000.0000 mg | ORAL_TABLET | Freq: Once | ORAL | Status: AC
  Administered 2015-05-07: 1000 mg via ORAL
  Filled 2015-05-07: qty 4

## 2015-05-07 MED ORDER — CEFTRIAXONE SODIUM 250 MG IJ SOLR
250.0000 mg | Freq: Once | INTRAMUSCULAR | Status: AC
  Administered 2015-05-07: 250 mg via INTRAMUSCULAR
  Filled 2015-05-07: qty 250

## 2015-05-07 MED ORDER — OXYCODONE-ACETAMINOPHEN 5-325 MG PO TABS
1.0000 | ORAL_TABLET | Freq: Once | ORAL | Status: AC
  Administered 2015-05-07: 1 via ORAL
  Filled 2015-05-07: qty 1

## 2015-05-07 MED ORDER — IBUPROFEN 200 MG PO TABS
600.0000 mg | ORAL_TABLET | Freq: Once | ORAL | Status: AC
  Administered 2015-05-07: 600 mg via ORAL
  Filled 2015-05-07: qty 3

## 2015-05-07 MED ORDER — STERILE WATER FOR INJECTION IJ SOLN
INTRAMUSCULAR | Status: AC
  Administered 2015-05-07: 1 mL
  Filled 2015-05-07: qty 10

## 2015-05-07 NOTE — Discharge Instructions (Signed)
Urethritis, Adult °Urethritis is an inflammation of the tube through which urine exits your bladder (urethra).  °CAUSES °Urethritis is often caused by an infection in your urethra. The infection can be viral, like herpes. The infection can also be bacterial, like gonorrhea. °RISK FACTORS °Risk factors of urethritis include: °· Having sex without using a condom. °· Having multiple sexual partners. °· Having poor hygiene. °SIGNS AND SYMPTOMS °Symptoms of urethritis are less noticeable in women than in men. These symptoms include: °· Burning feeling when you urinate (dysuria). °· Discharge from your urethra. °· Blood in your urine (hematuria). °· Urinating more than usual. °DIAGNOSIS  °To confirm a diagnosis of urethritis, your health care provider will do the following: °· Ask about your sexual history. °· Perform a physical exam. °· Have you provide a sample of your urine for lab testing. °· Use a cotton swab to gently collect a sample from your urethra for lab testing. °TREATMENT  °It is important to treat urethritis. Depending on the cause, untreated urethritis may lead to serious genital infections and possibly infertility. Urethritis caused by a bacterial infection is treated with antibiotic medicine. All sexual partners must be treated.  °HOME CARE INSTRUCTIONS °· Do not have sex until the test results are known and treatment is completed, even if your symptoms go away before you finish treatment. °· If you were prescribed an antibiotic, finish it all even if you start to feel better. °SEEK MEDICAL CARE IF:  °· Your symptoms are not improved in 3 days. °· Your symptoms are getting worse. °· You develop abdominal pain or pelvic pain (in women). °· You develop joint pain. °· You have a fever. °SEEK IMMEDIATE MEDICAL CARE IF:  °· You have severe pain in the belly, back, or side. °· You have repeated vomiting. °MAKE SURE YOU: °· Understand these instructions. °· Will watch your condition. °· Will get help right away  if you are not doing well or get worse. °  °This information is not intended to replace advice given to you by your health care provider. Make sure you discuss any questions you have with your health care provider. °  °Document Released: 10/18/2000 Document Revised: 09/08/2014 Document Reviewed: 12/23/2012 °Elsevier Interactive Patient Education ©2016 [employer] ° °
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
## 2015-05-07 NOTE — ED Notes (Signed)
Pt c/o low abdominal pain and penis pain onset yesterday after having sexual intercourse with two women without protection. No penile discharge, no dysuria.

## 2015-05-08 LAB — RPR: RPR Ser Ql: NONREACTIVE

## 2015-05-08 LAB — GC/CHLAMYDIA PROBE AMP (~~LOC~~) NOT AT ARMC
Chlamydia: NEGATIVE
Neisseria Gonorrhea: NEGATIVE

## 2016-04-12 ENCOUNTER — Emergency Department (HOSPITAL_COMMUNITY)
Admission: EM | Admit: 2016-04-12 | Discharge: 2016-04-12 | Disposition: A | Discharge status: Home / Self Care | Payer: Self-pay | Attending: Emergency Medicine | Admitting: Emergency Medicine

## 2016-04-12 ENCOUNTER — Encounter (HOSPITAL_COMMUNITY): Payer: Self-pay | Admitting: Emergency Medicine

## 2016-04-12 DIAGNOSIS — F17210 Nicotine dependence, cigarettes, uncomplicated: Secondary | ICD-10-CM | POA: Insufficient documentation

## 2016-04-12 DIAGNOSIS — R51 Headache: Secondary | ICD-10-CM | POA: Insufficient documentation

## 2016-04-12 DIAGNOSIS — R519 Headache, unspecified: Secondary | ICD-10-CM

## 2016-04-12 DIAGNOSIS — Z79899 Other long term (current) drug therapy: Secondary | ICD-10-CM | POA: Insufficient documentation

## 2016-04-12 MED ORDER — BUTALBITAL-APAP-CAFFEINE 50-325-40 MG PO TABS: 1.0000 | ORAL_TABLET | Freq: Four times a day (QID) | ORAL | 0 refills | Status: DC | PRN

## 2016-04-12 MED ORDER — BUTALBITAL-APAP-CAFFEINE 50-325-40 MG PO TABS
1.0000 | ORAL_TABLET | Freq: Once | ORAL | Status: AC
  Administered 2016-04-12: 1 via ORAL
  Filled 2016-04-12: qty 1

## 2016-04-12 NOTE — ED Notes (Signed)
Bed: WTR9 Expected date: 04/12/16 Expected time:  Means of arrival:  Comments:

## 2016-04-12 NOTE — ED Provider Notes (Signed)
[facility]-EMERGENCY DEPT Provider Note   CSN: [id number] Arrival date & time: 04/12/16  1004  By signing my name below, I, [name], attest that this documentation has been prepared under the direction and in the presence of Ashwin Tibbs, PA-C. Electronically Signed: [name], Neurosurgeoncribe. 04/12/16. 10:45 AM.  History   Chief Complaint Chief Complaint  Patient presents with  . Migraine    The history is provided by the patient. No language interpreter was used.     HPI Comments: Evan Wiley is a 35 y.o. male who presents to the Emergency Department complaining of a right sided, intermittent, throbbing HA that began 7 hours ago. Describes gradual onset. He reports similar presentation with prior migraine headaches. He states his migraine headaches occur when he does eat or sleep well; states he had nausea, vomiting, diarrhea last night which is now resolved. He thinks this triggered his migraine. He has taken ibuprofen and Vicodin in the past for relief. He states he has not tried Fioricet for his headaches. He denies blurry vision, dizziness, photophobia, phonophobia, nausea. Denies numbness, weakness, feeling lightheaded.  History reviewed. No pertinent past medical history.  There are no active problems to display for this patient.  History reviewed. No pertinent surgical history.    Home Medications    Prior to Admission medications   Medication Sig Start Date End Date Taking? Authorizing Provider  clindamycin (CLEOCIN) 150 MG capsule Take 1 capsule (150 mg total) by mouth every 6 (six) hours. Patient not taking: Reported on 05/01/2014 08/03/13   Marlon Peliffany Greene, PA-C  ondansetron (ZOFRAN) 8 MG tablet Take 1 tablet (8 mg total) by mouth every 8 (eight) hours as needed for nausea or vomiting. Patient not taking: Reported on 12/17/2014 05/01/14   Mercedes Camprubi-Soms, PA-C    Family History No family history on file.  Social History Social History  Substance Use Topics    . Smoking status: Current Every Day Smoker    Types: Cigarettes  . Smokeless tobacco: Never Used  . Alcohol use Yes     Comment: occasional     Allergies   Patient has no known allergies.   Review of Systems Review of Systems  Eyes: Negative for photophobia and visual disturbance.  Gastrointestinal: Negative for nausea.  Neurological: Positive for headaches. Negative for dizziness.  All other systems reviewed and are negative.    Physical Exam Updated Vital Signs BP 128/89 (BP Location: Right Arm)   Pulse 71   Temp 98.6 F (37 C) (Oral)   Resp 18   SpO2 100%   Physical Exam  Constitutional: He is oriented to person, place, and time. He appears well-developed and well-nourished.  HENT:  Head: Normocephalic and atraumatic.  Eyes: Conjunctivae and EOM are normal. Pupils are equal, round, and reactive to light.  No nystagmus  Neck: Normal range of motion. Neck supple.  No meningismus  Cardiovascular: Normal rate, regular rhythm and normal heart sounds.   Pulmonary/Chest: Effort normal and breath sounds normal. No respiratory distress. He has no wheezes. He has no rales.  Neurological: He is alert and oriented to person, place, and time. He displays normal reflexes. No cranial nerve deficit. He exhibits normal muscle tone. Coordination normal.  Moves all extremities well with good strength Normal finger to nose No pronator drift Steady gait  Skin: Skin is warm and dry.  Psychiatric: He has a normal mood and affect.  Nursing note and vitals reviewed.    ED Treatments / Results  DIAGNOSTIC STUDIES: Oxygen  Saturation is 100% on RA, normal by my interpretation.    COORDINATION OF CARE: 10:46 AM Discussed treatment plan with pt at bedside and pt agreed to plan.   Labs (all labs ordered are listed, but only abnormal results are displayed) Labs Reviewed - No data to display  EKG  EKG Interpretation None       Radiology No results  found.  Procedures Procedures (including critical care time)  Medications Ordered in ED Medications - No data to display   Initial Impression / Assessment and Plan / ED Course  I have reviewed the triage vital signs and the nursing notes.  Pertinent labs & imaging results that were available during my care of the patient were reviewed by me and considered in my medical decision making (see chart for details).  Clinical Course     Pt HA treated and improved while in ED.  Presentation is like pts typical HA and non concerning for [facility], ICH, Meningitis, or temporal arteritis. Pt is afebrile with no focal neuro deficits, nuchal rigidity, or change in vision. Pt is to follow up with PCP to discuss prophylactic medication. Pt verbalizes understanding and is agreeable with plan to dc.   Final Clinical Impressions(s) / ED Diagnoses   Final diagnoses:  Acute nonintractable headache, unspecified headache type    New Prescriptions New Prescriptions   BUTALBITAL-ACETAMINOPHEN-CAFFEINE (FIORICET, ESGIC) 50-325-40 MG TABLET    Take 1-2 tablets by mouth every 6 (six) hours as needed for headache.    I personally performed the services described in this documentation, which was scribed in my presence. The recorded information has been reviewed and is accurate.    Carlene CoriaSerena Y Danely Bayliss, PA-C 04/12/16 1126    Bethann BerkshireJoseph Zammit, MD 04/12/16 1444

## 2016-04-12 NOTE — ED Triage Notes (Addendum)
Per pt, states he thinks he might be have gotten food poisoning for [employer]-states vomiting and diarrhea since 0300-states now having a migraine-states abdominal issues have resolved-would like to be treated for his head ache so he can rest at home

## 2016-04-12 NOTE — Discharge Instructions (Signed)
Take medication as prescribed. [facility]. Return to the ER for new or worsening symptoms.

## 2016-07-13 ENCOUNTER — Encounter (HOSPITAL_COMMUNITY): Payer: Self-pay | Admitting: Emergency Medicine

## 2016-07-13 ENCOUNTER — Emergency Department (HOSPITAL_COMMUNITY)
Admission: EM | Admit: 2016-07-13 | Discharge: 2016-07-13 | Disposition: A | Discharge status: Home / Self Care | Payer: Self-pay | Attending: Emergency Medicine | Admitting: Emergency Medicine

## 2016-07-13 DIAGNOSIS — R42 Dizziness and giddiness: Secondary | ICD-10-CM | POA: Insufficient documentation

## 2016-07-13 DIAGNOSIS — F17210 Nicotine dependence, cigarettes, uncomplicated: Secondary | ICD-10-CM | POA: Insufficient documentation

## 2016-07-13 DIAGNOSIS — Z202 Contact with and (suspected) exposure to infections with a predominantly sexual mode of transmission: Secondary | ICD-10-CM | POA: Insufficient documentation

## 2016-07-13 LAB — URINALYSIS, ROUTINE W REFLEX MICROSCOPIC
Bilirubin Urine: NEGATIVE
GLUCOSE, UA: NEGATIVE mg/dL
HGB URINE DIPSTICK: NEGATIVE
Ketones, ur: 5 mg/dL — AB
Leukocytes, UA: NEGATIVE
Nitrite: NEGATIVE
Protein, ur: NEGATIVE mg/dL
SPECIFIC GRAVITY, URINE: 1.032 — AB (ref 1.005–1.030)
pH: 5 (ref 5.0–8.0)

## 2016-07-13 MED ORDER — CEFTRIAXONE SODIUM 250 MG IJ SOLR
250.0000 mg | Freq: Once | INTRAMUSCULAR | Status: AC
  Administered 2016-07-13: 250 mg via INTRAMUSCULAR
  Filled 2016-07-13: qty 250

## 2016-07-13 MED ORDER — AZITHROMYCIN 250 MG PO TABS
1000.0000 mg | ORAL_TABLET | Freq: Once | ORAL | Status: AC
  Administered 2016-07-13: 1000 mg via ORAL
  Filled 2016-07-13: qty 4

## 2016-07-13 MED ORDER — METRONIDAZOLE 500 MG PO TABS
2000.0000 mg | ORAL_TABLET | Freq: Once | ORAL | Status: AC
  Administered 2016-07-13: 2000 mg via ORAL
  Filled 2016-07-13: qty 4

## 2016-07-13 MED ORDER — LIDOCAINE HCL 1 % IJ SOLN
INTRAMUSCULAR | Status: AC
  Administered 2016-07-13: 1.5 mL
  Filled 2016-07-13: qty 20
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
## 2016-07-13 NOTE — Discharge Instructions (Signed)
You have been treated here for STD exposure to Chlamydia/gonorrhea/trichomoniasis. He do not need to take anything after leaving the emergency department regarding this. He do have cultures pending and will take about 2-5 days and he will be called if for any positive result. Please use safe sex and use condom when involved in sexual intercourse. Drink plenty of fluids throughout the day. Please follow up with a primary care provider in one week regarding today's visit. See attached financial resource guide.  Contact your health care provider right away if: You have severe abdominal pain. You are a man and notice swelling or pain in your testicles. You are a woman and notice swelling or pain in your vagina. Get help right away if: You vomit or have diarrhea and are unable to eat or drink anything. You have problems talking, walking, swallowing, or using your arms, hands, or legs. You feel generally weak. You are not thinking clearly or you have trouble forming sentences. It may take a friend or family member to notice this. You have chest pain, abdominal pain, shortness of breath, or sweating. Your vision changes. You notice any bleeding. You have a headache. You have neck pain or a stiff neck. You have a fever.

## 2016-10-18 ENCOUNTER — Emergency Department (HOSPITAL_COMMUNITY): Payer: Self-pay

## 2016-10-18 ENCOUNTER — Emergency Department (HOSPITAL_COMMUNITY)
Admission: EM | Admit: 2016-10-18 | Discharge: 2016-10-18 | Disposition: A | Discharge status: Home / Self Care | Payer: Self-pay | Attending: Emergency Medicine | Admitting: Emergency Medicine

## 2016-10-18 ENCOUNTER — Encounter (HOSPITAL_COMMUNITY): Payer: Self-pay | Admitting: Emergency Medicine

## 2016-10-18 DIAGNOSIS — J45909 Unspecified asthma, uncomplicated: Secondary | ICD-10-CM | POA: Insufficient documentation

## 2016-10-18 DIAGNOSIS — J069 Acute upper respiratory infection, unspecified: Secondary | ICD-10-CM | POA: Insufficient documentation

## 2016-10-18 DIAGNOSIS — F17210 Nicotine dependence, cigarettes, uncomplicated: Secondary | ICD-10-CM | POA: Insufficient documentation

## 2016-10-18 HISTORY — DX: Cardiac murmur, unspecified: R01.1

## 2016-10-18 HISTORY — DX: Unspecified asthma, uncomplicated: J45.909

## 2016-10-18 LAB — COMPREHENSIVE METABOLIC PANEL
ALBUMIN: 5 g/dL (ref 3.5–5.0)
ALK PHOS: 87 U/L (ref 38–126)
ALT: 20 U/L (ref 17–63)
AST: 25 U/L (ref 15–41)
Anion gap: 10 (ref 5–15)
BUN: 11 mg/dL (ref 6–20)
CALCIUM: 9.5 mg/dL (ref 8.9–10.3)
CHLORIDE: 105 mmol/L (ref 101–111)
CO2: 24 mmol/L (ref 22–32)
CREATININE: 0.88 mg/dL (ref 0.61–1.24)
GFR calc Af Amer: >60 mL/min (ref >60)
GFR calc non Af Amer: >60 mL/min (ref >60)
GLUCOSE: 113 mg/dL — AB (ref 65–99)
Potassium: 3.8 mmol/L (ref 3.5–5.1)
SODIUM: 139 mmol/L (ref 135–145)
Total Bilirubin: 0.6 mg/dL (ref 0.3–1.2)
Total Protein: 8.1 g/dL (ref 6.5–8.1)

## 2016-10-18 LAB — CBC
HCT: 44.3 % (ref 39.0–52.0)
Hemoglobin: 15.1 g/dL (ref 13.0–17.0)
MCH: 29.3 pg (ref 26.0–34.0)
MCHC: 34.1 g/dL (ref 30.0–36.0)
MCV: 85.9 fL (ref 78.0–100.0)
PLATELETS: 190 10*3/uL (ref 150–400)
RBC: 5.16 MIL/uL (ref 4.22–5.81)
RDW: 13.9 % (ref 11.5–15.5)
WBC: 7 10*3/uL (ref 4.0–10.5)

## 2016-10-18 LAB — URINALYSIS, ROUTINE W REFLEX MICROSCOPIC
BILIRUBIN URINE: NEGATIVE
GLUCOSE, UA: NEGATIVE mg/dL
HGB URINE DIPSTICK: NEGATIVE
Ketones, ur: NEGATIVE mg/dL
Leukocytes, UA: NEGATIVE
Nitrite: NEGATIVE
PROTEIN: NEGATIVE mg/dL
SPECIFIC GRAVITY, URINE: 1.025 (ref 1.005–1.030)
pH: 5 (ref 5.0–8.0)

## 2016-10-18 LAB — LIPASE, BLOOD: LIPASE: 27 U/L (ref 11–51)

## 2016-10-18 LAB — RAPID STREP SCREEN (MED CTR MEBANE ONLY): STREPTOCOCCUS, GROUP A SCREEN (DIRECT): NEGATIVE

## 2016-10-18 MED ORDER — AEROCHAMBER PLUS FLO-VU MEDIUM MISC
1.0000 | Freq: Once | Status: AC
  Administered 2016-10-18: 1
  Filled 2016-10-18: qty 1

## 2016-10-18 MED ORDER — SODIUM CHLORIDE 0.9 % IV BOLUS (SEPSIS)
500.0000 mL | Freq: Once | INTRAVENOUS | Status: AC
  Administered 2016-10-18: 500 mL via INTRAVENOUS

## 2016-10-18 MED ORDER — ALBUTEROL SULFATE HFA 108 (90 BASE) MCG/ACT IN AERS
2.0000 | INHALATION_SPRAY | RESPIRATORY_TRACT | Status: DC | PRN
  Administered 2016-10-18: 2 via RESPIRATORY_TRACT
  Filled 2016-10-18: qty 6.7

## 2016-10-18 MED ORDER — IBUPROFEN 800 MG PO TABS: 800.0000 mg | ORAL_TABLET | Freq: Once | ORAL | Status: DC

## 2016-10-18 NOTE — ED Notes (Signed)
Patient outside room yelling at staff, stating "I am ready to leave I have been here for too long." Removed IV and made EDP aware.

## 2016-10-18 NOTE — ED Notes (Signed)
Patient transported to X-ray 

## 2016-10-18 NOTE — ED Provider Notes (Signed)
[facility]-EMERGENCY DEPT Provider Note   CSN: [id number] Arrival date & time: 10/18/16  1523     History   Chief Complaint Chief Complaint  Patient presents with  . Nasal Congestion  . Abdominal Pain    HPI Evan Wiley is a 36 y.o. male presenting with sudden onset sore throat described as a burning sensation, congestion, coughing, nausea, vomiting, nonbloody watery diarrhea. He reports that he was working in the rain getting Hot and cold multiple times and experienced the symptoms later that day. He has tried DayQuil with some relief and Benadryl without relief. He initially thought that he was having some bad allergies as he has a history of allergies. Patient denies ill contacts but has a 57-year-old in daycare right now. He endorses a fever of 101 at home.   HPI  Past Medical History:  Diagnosis Date  . Asthma   . Heart murmur     There are no active problems to display for this patient.   Past Surgical History:  Procedure Laterality Date  . WISDOM TOOTH EXTRACTION         Home Medications    Prior to Admission medications   Medication Sig Start Date End Date Taking? Authorizing Provider  diphenhydrAMINE (BENADRYL) 25 mg capsule Take 50 mg by mouth every 6 (six) hours as needed.   Yes [provider]  Pseudoeph-Doxylamine-DM-APAP (DAYQUIL/NYQUIL COLD/FLU RELIEF PO) Take 2 capsules by mouth daily.   Yes [provider]    Family History Family History  Problem Relation Age of Onset  . Hypertension Mother   . Diabetes Mother   . Diabetes Father   . Hypertension Father     Social History Social History  Substance Use Topics  . Smoking status: Current Every Day Smoker    Packs/day: 1.00    Types: Cigarettes  . Smokeless tobacco: Never Used  . Alcohol use No     Allergies   Bee venom   Review of Systems Review of Systems  Constitutional: Positive for fever. Negative for chills.  HENT: Positive for congestion, postnasal  drip, rhinorrhea, sinus pressure and sore throat. Negative for ear pain and trouble swallowing.   Eyes: Negative for pain and visual disturbance.  Respiratory: Positive for cough. Negative for shortness of breath, wheezing and stridor.        Chest congestion  Cardiovascular: Negative for chest pain and palpitations.  Gastrointestinal: Positive for diarrhea, nausea and vomiting. Negative for abdominal distention, abdominal pain and blood in stool.  Genitourinary: Negative for difficulty urinating, dysuria, flank pain, frequency and hematuria.  Musculoskeletal: Negative for arthralgias, back pain, gait problem, joint swelling, myalgias, neck pain and neck stiffness.  Skin: Negative for color change, pallor and rash.  Neurological: Positive for headaches. Negative for dizziness, seizures, syncope, weakness, light-headedness and numbness.     Physical Exam Updated Vital Signs BP 122/87   Pulse 74   Temp 98.5 F (36.9 C) (Oral)   Resp 20   Wt 90.3 kg (199 lb)   SpO2 98%   BMI 27.75 kg/m   Physical Exam  Constitutional: He appears well-developed and well-nourished. No distress.  Patient is afebrile, nontoxic-appearing, lying comfortably in bed extending texting on his phone no acute distress.  HENT:  Head: Normocephalic and atraumatic.  Oropharynx is erythematous  Eyes: Conjunctivae and EOM are normal. Pupils are equal, round, and reactive to light. Right eye exhibits no discharge. Left eye exhibits no discharge. No scleral icterus.  Neck: Normal range of motion. Neck  supple.  Cardiovascular: Normal rate and regular rhythm.   Pulmonary/Chest: Effort normal and breath sounds normal. No respiratory distress. He has no wheezes. He has no rales. He exhibits no tenderness.  Abdominal: Soft. He exhibits no distension and no mass. There is no tenderness. There is no rebound and no guarding.  Abdomen was soft and nontender to palpation. Patient denies any abdominal pain on my assessment.    Musculoskeletal: He exhibits no edema.  Lymphadenopathy:    He has no cervical adenopathy.  Neurological: He is alert.  Skin: Skin is warm and dry. He is not diaphoretic. No erythema. No pallor.  Psychiatric: He has a normal mood and affect.  Nursing note and vitals reviewed.    ED Treatments / Results  Labs (all labs ordered are listed, but only abnormal results are displayed) Labs Reviewed  COMPREHENSIVE METABOLIC PANEL - Abnormal; Notable for the following:       Result Value   Glucose, Bld 113 (*)    All other components within normal limits  RAPID STREP SCREEN (NOT AT [facility])  CULTURE, GROUP A STREP ([facility])  LIPASE, BLOOD  CBC  URINALYSIS, ROUTINE W REFLEX MICROSCOPIC    EKG  EKG Interpretation None       Radiology Dg Chest 2 View  Result Date: 10/18/2016 CLINICAL DATA:  36 year old male with severe abdominal pain, nausea and diarrhea. Productive cough, congestion and sinus pain. Weakness. Tick bite 2 days ago. EXAM: CHEST  2 VIEW COMPARISON:  Chest radiographs 02/19/2012. FINDINGS: Normal lung volumes. Normal cardiac size and mediastinal contours. Visualized tracheal air column is within normal limits. No pneumothorax or pneumoperitoneum. Negative visible bowel gas pattern. No pulmonary edema, pleural effusion or confluent pulmonary opacity. No acute osseous abnormality identified. IMPRESSION: Negative.  No acute cardiopulmonary abnormality. Electronically Signed   By: Odessa FlemingH  Hall M.D.   On: 10/18/2016 18:54    Procedures Procedures (including critical care time)  Medications Ordered in ED Medications  AEROCHAMBER PLUS FLO-VU MEDIUM MISC 1 each (1 each Other Given 10/18/16 1914)  sodium chloride 0.9 % bolus 500 mL (0 mLs Intravenous Stopped 10/18/16 2004)     Initial Impression / Assessment and Plan / ED Course  I have reviewed the triage vital signs and the nursing notes.  Pertinent labs & imaging results that were available during my care of the patient were  reviewed by me and considered in my medical decision making (see chart for details).    Patient presents with sudden onset sore throat, congestion, cough, nausea, vomiting, diarrhea for a day.  He denies having a PCP and hasn't had an inhaler for a while. Provided patient with inhaler and spacer.  Given IV fluids.  Labs unremarkable, CXR negative, strep negative. Patient was given MDI and spacer in ED.  Symptoms are consistent with URI and exam, labs reassuring today. Will discharge home with symptomatic relief and PCP follow up. Advised to remain hydrated and get rest.  Discussed strict return precautions and advised to return to the emergency department if experiencing any new or worsening symptoms. Instructions were understood and patient agreed with discharge plan. Final Clinical Impressions(s) / ED Diagnoses   Final diagnoses:  Viral upper respiratory tract infection    New Prescriptions Discharge Medication List as of 10/18/2016  8:51 PM       [place] ShoreMitchell, [place] Dann B, PA-C 10/19/16 0054    Shaune PollackIsaacs, [place], MD 10/19/16 [phone]

## 2016-10-18 NOTE — ED Notes (Signed)
ED Provider at bedside. 

## 2016-10-18 NOTE — Discharge Instructions (Signed)
As discussed, please stay well hydrated and keep your urine clear. Try to get some rest, drink tea with honey to soothe her throat or any may use throat sprays over-the-counter. NyQuil and DayQuil as fine for relief of symptoms. Follow-up with a primary care provider. Return if you experience worsening condition, or any new concerning symptoms in the meantime.

## 2016-10-18 NOTE — ED Triage Notes (Signed)
Pt from home with complaints of generalized abdominal pain that he rates 10/10 and describes as sharp. Pt reports nausea and diarrhea. Pt reports 4 episodes of diarrhea today. Pt reports sinus pain, congestion, and productive cough.  Pt reports spot on his leg from a tick bite 2 days ago.

## 2016-10-21 LAB — CULTURE, GROUP A STREP (THRC)

## 2016-12-06 ENCOUNTER — Emergency Department (HOSPITAL_COMMUNITY)
Admission: EM | Admit: 2016-12-06 | Discharge: 2016-12-06 | Disposition: A | Discharge status: Home / Self Care | Payer: Self-pay | Attending: Emergency Medicine | Admitting: Emergency Medicine

## 2016-12-06 ENCOUNTER — Emergency Department (HOSPITAL_COMMUNITY): Payer: Self-pay

## 2016-12-06 DIAGNOSIS — R519 Headache, unspecified: Secondary | ICD-10-CM

## 2016-12-06 DIAGNOSIS — R55 Syncope and collapse: Secondary | ICD-10-CM | POA: Insufficient documentation

## 2016-12-06 DIAGNOSIS — Z79899 Other long term (current) drug therapy: Secondary | ICD-10-CM | POA: Insufficient documentation

## 2016-12-06 DIAGNOSIS — R51 Headache: Secondary | ICD-10-CM | POA: Insufficient documentation

## 2016-12-06 DIAGNOSIS — J45909 Unspecified asthma, uncomplicated: Secondary | ICD-10-CM | POA: Insufficient documentation

## 2016-12-06 DIAGNOSIS — F17210 Nicotine dependence, cigarettes, uncomplicated: Secondary | ICD-10-CM | POA: Insufficient documentation

## 2016-12-06 DIAGNOSIS — H66003 Acute suppurative otitis media without spontaneous rupture of ear drum, bilateral: Secondary | ICD-10-CM | POA: Insufficient documentation

## 2016-12-06 LAB — CBC
HEMATOCRIT: 43.7 % (ref 39.0–52.0)
Hemoglobin: 15 g/dL (ref 13.0–17.0)
MCH: 29.4 pg (ref 26.0–34.0)
MCHC: 34.3 g/dL (ref 30.0–36.0)
MCV: 85.5 fL (ref 78.0–100.0)
Platelets: 213 10*3/uL (ref 150–400)
RBC: 5.11 MIL/uL (ref 4.22–5.81)
RDW: 14.3 % (ref 11.5–15.5)
WBC: 5.7 10*3/uL (ref 4.0–10.5)

## 2016-12-06 LAB — BASIC METABOLIC PANEL
ANION GAP: 7 (ref 5–15)
BUN: 16 mg/dL (ref 6–20)
CHLORIDE: 108 mmol/L (ref 101–111)
CO2: 25 mmol/L (ref 22–32)
Calcium: 9.2 mg/dL (ref 8.9–10.3)
Creatinine, Ser: 0.86 mg/dL (ref 0.61–1.24)
GFR calc Af Amer: >60 mL/min (ref >60)
GFR calc non Af Amer: >60 mL/min (ref >60)
GLUCOSE: 153 mg/dL — AB (ref 65–99)
POTASSIUM: 3.9 mmol/L (ref 3.5–5.1)
Sodium: 140 mmol/L (ref 135–145)

## 2016-12-06 MED ORDER — AMOXICILLIN 500 MG PO CAPS
1000.0000 mg | ORAL_CAPSULE | Freq: Once | ORAL | Status: AC
  Administered 2016-12-06: 1000 mg via ORAL
  Filled 2016-12-06: qty 2

## 2016-12-06 MED ORDER — DIPHENHYDRAMINE HCL 50 MG/ML IJ SOLN
25.0000 mg | Freq: Once | INTRAMUSCULAR | Status: DC
  Filled 2016-12-06: qty 1

## 2016-12-06 MED ORDER — AMOXICILLIN 500 MG PO CAPS: 1000.0000 mg | ORAL_CAPSULE | Freq: Two times a day (BID) | ORAL | 0 refills | Status: DC

## 2016-12-06 MED ORDER — PROCHLORPERAZINE EDISYLATE 5 MG/ML IJ SOLN
10.0000 mg | Freq: Once | INTRAMUSCULAR | Status: AC
  Administered 2016-12-06: 10 mg via INTRAMUSCULAR
  Filled 2016-12-06: qty 2

## 2016-12-06 NOTE — ED Triage Notes (Signed)
Pt states that he had a headache and LOC yesterday and now has L ear pain and dizziness. Alert and oriented.

## 2016-12-06 NOTE — Discharge Instructions (Signed)
Take 4 over the counter ibuprofen tablets 3 times a day or 2 over-the-counter naproxen tablets twice a day for pain. Also take tylenol 1000mg (2 extra strength) four times a day.    He could have a disease called Mnire's. Typically this is diagnosed by an ENT doctor. If you're having recurrent headaches he needs to see a neurologist.

## 2016-12-06 NOTE — ED Notes (Signed)
When nurse entered the room, the pt and significant other were raising their voices and seemed to be arguing. Raised voices could also be heard outside the room.

## 2016-12-06 NOTE — ED Notes (Signed)
Pt refused vitals stating "he had to go pick up his son from daycare"

## 2016-12-06 NOTE — ED Notes (Signed)
Bed: WTR7 Expected date:  Expected time:  Means of arrival:  Comments: 

## 2016-12-06 NOTE — ED Notes (Signed)
Pt left without getting head CT, but MD put in discharge orders to make sure pt got prescription for ear infection.  Pt referred to specialists.

## 2016-12-06 NOTE — ED Provider Notes (Signed)
[facility]-EMERGENCY DEPT Provider Note   CSN: [id number] Arrival date & time: 12/06/16  1052     History   Chief Complaint Chief Complaint  Patient presents with  . Headache  . Otalgia    HPI Evan Wiley is a 36 y.o. male.  36 yo M with a chief complaints of a right frontal headache. The patient was having this off and on for the past years or so. The patient was driving his car yesterday had a recurrence of his headache and then thinks he blacked out for a second or 2. He has had episodes where he blacks out in the past. Usually when he coughs or stands up suddenly. He also been complaining of left ear pain. Feels that it's ringing and feels that his hearing is diminished there. Denies neck pain. Denies fever. Is chronically congested denies any change that.  He describes his dizziness as a feeling that he is unsteady. Worse with lying back flat.   The history is provided by the patient.  Headache   This is a new problem. The current episode started yesterday. The problem occurs constantly. The problem has not changed since onset.The headache is associated with nothing. The pain is located in the frontal (right frontal) region. The quality of the pain is described as dull. The pain is at a severity of 8/10. The pain is moderate. The pain does not radiate. Pertinent negatives include no fever, no palpitations, no shortness of breath and no vomiting. He has tried nothing for the symptoms. The treatment provided no relief.  Otalgia  Associated symptoms include headaches. Pertinent negatives include no abdominal pain, no diarrhea, no vomiting and no rash.    Past Medical History:  Diagnosis Date  . Asthma   . Heart murmur     There are no active problems to display for this patient.   Past Surgical History:  Procedure Laterality Date  . WISDOM TOOTH EXTRACTION         Home Medications    Prior to Admission medications   Medication Sig Start Date End Date Taking?  Authorizing Provider  CALCIUM PO Take 1 tablet by mouth daily.   Yes [provider]  Multiple Vitamin (MULTIVITAMIN WITH MINERALS) TABS tablet Take 1 tablet by mouth daily.   Yes [provider]  amoxicillin (AMOXIL) 500 MG capsule Take 2 capsules (1,000 mg total) by mouth 2 (two) times daily. 12/06/16   Melene Plan, DO  diphenhydrAMINE (BENADRYL) 25 mg capsule Take 50 mg by mouth every 6 (six) hours as needed.    [provider]  Pseudoeph-Doxylamine-DM-APAP (DAYQUIL/NYQUIL COLD/FLU RELIEF PO) Take 2 capsules by mouth daily.    [provider]    Family History Family History  Problem Relation Age of Onset  . Hypertension Mother   . Diabetes Mother   . Diabetes Father   . Hypertension Father     Social History Social History  Substance Use Topics  . Smoking status: Current Every Day Smoker    Packs/day: 1.00    Types: Cigarettes  . Smokeless tobacco: Never Used  . Alcohol use No     Allergies   Bee venom   Review of Systems Review of Systems  Constitutional: Negative for chills and fever.  HENT: Positive for ear pain. Negative for congestion and facial swelling.   Eyes: Negative for discharge and visual disturbance.  Respiratory: Negative for shortness of breath.   Cardiovascular: Negative for chest pain and palpitations.  Gastrointestinal: Negative for  abdominal pain, diarrhea and vomiting.  Musculoskeletal: Negative for arthralgias and myalgias.  Skin: Negative for color change and rash.  Neurological: Positive for dizziness, syncope and headaches. Negative for tremors.  Psychiatric/Behavioral: Negative for confusion and dysphoric mood.     Physical Exam Updated Vital Signs BP 120/82 (BP Location: Right Arm)   Pulse 90   Temp 98.2 F (36.8 C) (Oral)   Resp 18   SpO2 100%   Physical Exam  Constitutional: He is oriented to person, place, and time. He appears well-developed and well-nourished.  HENT:  Head: Normocephalic and  atraumatic.  Swollen turbinates, posterior nasal drip, no noted sinus ttp, right TM is erythematous and has a purulent appearing drainage. Left TM has a serous appearing drainage and is bulging.   Eyes: Pupils are equal, round, and reactive to light. EOM are normal.  Neck: Normal range of motion. Neck supple. No JVD present.  Cardiovascular: Normal rate and regular rhythm.  Exam reveals no gallop and no friction rub.   No murmur heard. Pulmonary/Chest: No respiratory distress. He has no wheezes.  Abdominal: He exhibits no distension and no mass. There is no tenderness. There is no rebound and no guarding.  Musculoskeletal: Normal range of motion.  Neurological: He is alert and oriented to person, place, and time. He has normal strength. No cranial nerve deficit or sensory deficit. He displays a negative Romberg sign. Coordination and gait normal. GCS eye subscore is 4. GCS verbal subscore is 5. GCS motor subscore is 6. He displays no Babinski's sign on the right side. He displays no Babinski's sign on the left side.  Reflex Scores:      Tricep reflexes are 2+ on the right side and 2+ on the left side.      Bicep reflexes are 2+ on the right side and 2+ on the left side.      Brachioradialis reflexes are 2+ on the right side and 2+ on the left side.      Patellar reflexes are 2+ on the right side and 2+ on the left side.      Achilles reflexes are 2+ on the right side and 2+ on the left side. Benign exam  Skin: No rash noted. No pallor.  Psychiatric: He has a normal mood and affect. His behavior is normal.  Nursing note and vitals reviewed.    ED Treatments / Results  Labs (all labs ordered are listed, but only abnormal results are displayed) Labs Reviewed  BASIC METABOLIC PANEL - Abnormal; Notable for the following:       Result Value   Glucose, Bld 153 (*)    All other components within normal limits  CBC  CBG MONITORING, ED    EKG  EKG Interpretation  Date/Time:  Wednesday  December 06 2016 11:26:09 EDT Ventricular Rate:  85 PR Interval:    QRS Duration: 91 QT Interval:  343 QTC Calculation: 408 R Axis:   68 Text Interpretation:  Sinus rhythm ST elevation suggests acute pericarditis Baseline wander in lead(s) I aVL No significant change since last tracing Confirmed by Melene PlanFloyd, Tosh Glaze [phone]([id number]) on 12/06/2016 1:22:44 PM       Radiology No results found.  Procedures Procedures (including critical care time)  Medications Ordered in ED Medications  diphenhydrAMINE (BENADRYL) injection 25 mg (25 mg Intramuscular Refused 12/06/16 1328)  prochlorperazine (COMPAZINE) injection 10 mg (10 mg Intramuscular Given 12/06/16 1325)  amoxicillin (AMOXIL) capsule 1,000 mg (1,000 mg Oral Given 12/06/16 1337)     Initial  Impression / Assessment and Plan / ED Course  I have reviewed the triage vital signs and the nursing notes.  Pertinent labs & imaging results that were available during my care of the patient were reviewed by me and considered in my medical decision making (see chart for details).     36 yo M with a chief complaint of a headache, possible syncopal episode, and ringing in his left ear. The patient's dizziness is somewhat, K by the fact that he has passed out with it. I think most likely this is on near syncopal feeling. His EKG is unremarkable. He had labs drawn triage that are also unremarkable. With his sudden headache and syncopal event will obtain a CT of the head. Given a headache cocktail. Exam consistent with otitis media. Will treat with antibiotics. As the patient has ringing and dizziness there is possibility of Mnire's disease. We'll give follow-up with ENT. He's been having recurrent headaches like this will give follow-up with neurology.  The patient is also describing multiple syncopal events that usually occur with coughing or standing up too fast. This likely also need to be followed by family physician. Discussed this with him in the room at length. He  is hesitant to follow-up as he has no insurance. Discussed need to have a normal family physician as well.  2:32 PM:  I have discussed the diagnosis/risks/treatment options with the patient and family and believe the pt to be eligible for discharge home to follow-up with PCP, ENT, neuro. We also discussed returning to the ED immediately if new or worsening sx occur. We discussed the sx which are most concerning (e.g., sudden worsening pain, fever, inability to tolerate by mouth) that necessitate immediate return. Medications administered to the patient during their visit and any new prescriptions provided to the patient are listed below.  Medications given during this visit Medications  diphenhydrAMINE (BENADRYL) injection 25 mg (25 mg Intramuscular Refused 12/06/16 1328)  prochlorperazine (COMPAZINE) injection 10 mg (10 mg Intramuscular Given 12/06/16 1325)  amoxicillin (AMOXIL) capsule 1,000 mg (1,000 mg Oral Given 12/06/16 1337)     The patient appears reasonably screen and/or stabilized for discharge and I doubt any other medical condition or other [facility] requiring further screening, evaluation, or treatment in the ED at this time prior to discharge.    Final Clinical Impressions(s) / ED Diagnoses   Final diagnoses:  Acute suppurative otitis media of both ears without spontaneous rupture of tympanic membranes, recurrence not specified  Syncope and collapse  Frontal headache    New Prescriptions New Prescriptions   AMOXICILLIN (AMOXIL) 500 MG CAPSULE    Take 2 capsules (1,000 mg total) by mouth 2 (two) times daily.     Melene PlanFloyd, Yocelin Vanlue, DO 12/06/16 1432

## 2017-04-20 ENCOUNTER — Encounter (HOSPITAL_COMMUNITY): Payer: Self-pay | Admitting: Emergency Medicine

## 2017-04-20 ENCOUNTER — Other Ambulatory Visit: Payer: Self-pay

## 2017-04-20 ENCOUNTER — Emergency Department (HOSPITAL_COMMUNITY)
Admission: EM | Admit: 2017-04-20 | Discharge: 2017-04-20 | Disposition: A | Discharge status: Home / Self Care | Payer: Self-pay | Attending: Emergency Medicine | Admitting: Emergency Medicine

## 2017-04-20 DIAGNOSIS — Z79899 Other long term (current) drug therapy: Secondary | ICD-10-CM | POA: Insufficient documentation

## 2017-04-20 DIAGNOSIS — H66002 Acute suppurative otitis media without spontaneous rupture of ear drum, left ear: Secondary | ICD-10-CM | POA: Insufficient documentation

## 2017-04-20 DIAGNOSIS — F17210 Nicotine dependence, cigarettes, uncomplicated: Secondary | ICD-10-CM | POA: Insufficient documentation

## 2017-04-20 DIAGNOSIS — J45909 Unspecified asthma, uncomplicated: Secondary | ICD-10-CM | POA: Insufficient documentation

## 2017-04-20 MED ORDER — AMOXICILLIN 500 MG PO CAPS: 500.0000 mg | ORAL_CAPSULE | Freq: Two times a day (BID) | ORAL | 0 refills | Status: AC

## 2017-04-20 NOTE — ED Triage Notes (Signed)
Pt states he is having difficulty to hear from his left ear, pt has a cold for about a week, pt states is not really pain is like feeling ear wet and difficult to listen. No fever or chills.

## 2017-04-20 NOTE — ED Provider Notes (Signed)
[facility] Provider Note   CSN: [id number] Arrival date & time: 04/20/17  0531     History   Chief Complaint Chief Complaint  Patient presents with  . Otalgia    HPI Evan ArthursLarry Travis Wiley is a 36 y.o. male.  The history is provided by the patient.  Otalgia  This is a new problem. The current episode started 12 to 24 hours ago. There is pain in the left ear. The problem occurs constantly. The problem has not changed since onset.There has been no fever. The pain is moderate. Associated symptoms include cough. Pertinent negatives include no rhinorrhea, no sore throat, no diarrhea and no vomiting.   36 year old male who presents with left ear otalgia.  Reports 4-5 days of cold-like symptoms including cough, congestion, runny nose, nausea, vomiting and diarrhea.  Those symptoms have overall resolved, and he only has mild cough.  This morning woke up with left ear pain and a sensation of fullness with some diminished hearing.  Denies any fevers or chills.  he has children at home who are sick with URI illnesses prior to him.  No severe headaches or confusion. Past Medical History:  Diagnosis Date  . Asthma   . Heart murmur     There are no active problems to display for this patient.   Past Surgical History:  Procedure Laterality Date  . WISDOM TOOTH EXTRACTION         Home Medications    Prior to Admission medications   Medication Sig Start Date End Date Taking? Authorizing Provider  amoxicillin (AMOXIL) 500 MG capsule Take 1 capsule (500 mg total) by mouth 2 (two) times daily for 7 days. 04/20/17 04/27/17  Lavera GuiseLiu, Nereida Schepp Duo, MD  CALCIUM PO Take 1 tablet by mouth daily.    [provider]  diphenhydrAMINE (BENADRYL) 25 mg capsule Take 50 mg by mouth every 6 (six) hours as needed.    [provider]  Multiple Vitamin (MULTIVITAMIN WITH MINERALS) TABS tablet Take 1 tablet by mouth daily.    [provider]    Pseudoeph-Doxylamine-DM-APAP (DAYQUIL/NYQUIL COLD/FLU RELIEF PO) Take 2 capsules by mouth daily.    [provider]    Family History Family History  Problem Relation Age of Onset  . Hypertension Mother   . Diabetes Mother   . Diabetes Father   . Hypertension Father     Social History Social History   Tobacco Use  . Smoking status: Current Every Day Smoker    Packs/day: 1.00    Types: Cigarettes  . Smokeless tobacco: Never Used  Substance Use Topics  . Alcohol use: No  . Drug use: Yes    Comment: marijuana daily     Allergies   Bee venom   Review of Systems Review of Systems  HENT: Positive for ear pain. Negative for rhinorrhea and sore throat.   Respiratory: Positive for cough.   Gastrointestinal: Negative for diarrhea and vomiting.  All other systems reviewed and are negative.    Physical Exam Updated Vital Signs BP (!) 120/93 (BP Location: Right Arm)   Pulse 65   Temp 97.6 F (36.4 C) (Oral)   Resp 16   Ht 5\' 11"  (1.803 m)   Wt 90.3 kg (199 lb)   SpO2 99%   BMI 27.75 kg/m   Physical Exam Physical Exam  Nursing note and vitals reviewed. Constitutional: Well developed, well nourished, non-toxic, and in no acute distress Head: Normocephalic and atraumatic.  Ear: Normal right  tympanic membrane.  Left tympanic membrane slightly bulging with purulent middle ear effusion. Mouth/Throat: Oropharynx is clear and moist.  Neck: Normal range of motion. Neck supple.  Cardiovascular: Normal rate and regular rhythm.   Pulmonary/Chest: Effort normal and breath sounds normal.  Abdominal: Soft. There is no tenderness. There is no rebound and no guarding.  Musculoskeletal: Normal range of motion.  Neurological: Alert, no facial droop, fluent speech, moves all extremities symmetrically Skin: Skin is warm and dry.  Psychiatric: Cooperative   ED Treatments / Results  Labs (all labs ordered are listed, but only abnormal results are displayed) Labs  Reviewed - No data to display  EKG  EKG Interpretation None       Radiology No results found.  Procedures Procedures (including critical care time)  Medications Ordered in ED Medications - No data to display   Initial Impression / Assessment and Plan / ED Course  I have reviewed the triage vital signs and the nursing notes.  Pertinent labs & imaging results that were available during my care of the patient were reviewed by me and considered in my medical decision making (see chart for details).     Recent viral illness, now with left ear pain.  On exam he appears to have acute supprative otitis media.  Afebrile, hemodynamically stable. No immune suppression or mastoid tenderness. Well appearing.  Will treat with course of amoxicillin. Strict return and follow-up instructions reviewed. He expressed understanding of all discharge instructions and felt comfortable with the plan of care.   Final Clinical Impressions(s) / ED Diagnoses   Final diagnoses:  Acute suppurative otitis media of left ear without spontaneous rupture of tympanic membrane, recurrence not specified    ED Discharge Orders        Ordered    amoxicillin (AMOXIL) 500 MG capsule  2 times daily     04/20/17 0815       Lavera GuiseLiu, Alexxus Sobh Duo, MD 04/20/17 [phone]

## 2017-04-20 NOTE — Discharge Instructions (Signed)
You have a left ear infection. Take ibuprofen and tylenol for pain Take antibiotics as prescribed Return for worsening symptoms, including fever, confusion, escalating pain or any other symptoms concerning to you.

## 2017-12-12 ENCOUNTER — Other Ambulatory Visit: Payer: Self-pay

## 2017-12-12 ENCOUNTER — Encounter (HOSPITAL_COMMUNITY): Payer: Self-pay

## 2017-12-12 ENCOUNTER — Emergency Department (HOSPITAL_COMMUNITY)
Admission: EM | Admit: 2017-12-12 | Discharge: 2017-12-12 | Disposition: A | Discharge status: Home / Self Care | Payer: Self-pay | Attending: Emergency Medicine | Admitting: Emergency Medicine

## 2017-12-12 DIAGNOSIS — J029 Acute pharyngitis, unspecified: Secondary | ICD-10-CM | POA: Insufficient documentation

## 2017-12-12 DIAGNOSIS — F17210 Nicotine dependence, cigarettes, uncomplicated: Secondary | ICD-10-CM | POA: Insufficient documentation

## 2017-12-12 DIAGNOSIS — B349 Viral infection, unspecified: Secondary | ICD-10-CM | POA: Insufficient documentation

## 2017-12-12 DIAGNOSIS — J45909 Unspecified asthma, uncomplicated: Secondary | ICD-10-CM | POA: Insufficient documentation

## 2017-12-12 LAB — BASIC METABOLIC PANEL
ANION GAP: 10 (ref 5–15)
BUN: 15 mg/dL (ref 6–20)
CO2: 24 mmol/L (ref 22–32)
Calcium: 9.4 mg/dL (ref 8.9–10.3)
Chloride: 106 mmol/L (ref 98–111)
Creatinine, Ser: 1.08 mg/dL (ref 0.61–1.24)
Glucose, Bld: 101 mg/dL — ABNORMAL HIGH (ref 70–99)
Potassium: 3.5 mmol/L (ref 3.5–5.1)
SODIUM: 140 mmol/L (ref 135–145)

## 2017-12-12 LAB — CBC WITH DIFFERENTIAL/PLATELET
BASOS ABS: 0 10*3/uL (ref 0.0–0.1)
BASOS PCT: 0 %
EOS ABS: 0.1 10*3/uL (ref 0.0–0.7)
EOS PCT: 1 %
HCT: 43.4 % (ref 39.0–52.0)
Hemoglobin: 14.6 g/dL (ref 13.0–17.0)
Lymphocytes Relative: 15 %
Lymphs Abs: 1.2 10*3/uL (ref 0.7–4.0)
MCH: 29.5 pg (ref 26.0–34.0)
MCHC: 33.6 g/dL (ref 30.0–36.0)
MCV: 87.7 fL (ref 78.0–100.0)
Monocytes Absolute: 0.8 10*3/uL (ref 0.1–1.0)
Monocytes Relative: 10 %
Neutro Abs: 5.9 10*3/uL (ref 1.7–7.7)
Neutrophils Relative %: 74 %
PLATELETS: 198 10*3/uL (ref 150–400)
RBC: 4.95 MIL/uL (ref 4.22–5.81)
RDW: 14.1 % (ref 11.5–15.5)
WBC: 8 10*3/uL (ref 4.0–10.5)

## 2017-12-12 LAB — GROUP A STREP BY PCR: GROUP A STREP BY PCR: NOT DETECTED

## 2017-12-12 MED ORDER — ACETAMINOPHEN 325 MG PO TABS
650.0000 mg | ORAL_TABLET | Freq: Once | ORAL | Status: AC | PRN
  Administered 2017-12-12: 650 mg via ORAL
  Filled 2017-12-12: qty 2

## 2017-12-12 NOTE — Discharge Instructions (Addendum)
I think there is a viral illness going around the house. Continue to drink plenty of water and eat. Your symptoms should pass in a couple of days. You may continue taking Motrin for fever and headache.   Your son is going to get you back one day!

## 2017-12-12 NOTE — ED Triage Notes (Signed)
Patient c/o fever, body aches, fever, and sore throat x 2 days.

## 2017-12-12 NOTE — ED Provider Notes (Signed)
[facility]-EMERGENCY DEPT Provider Note  CSN: [id number] Arrival date & time: 12/12/17  1658    History   Chief Complaint Chief Complaint  Patient presents with  . Fever  . Sore Throat  . Generalized Body Aches    HPI Evan Wiley is a 37 y.o. male with a medical history of asthma who presented to the ED for fever, generalized body aches and sore throat x2 days. Denies upper respiratory symptoms, chest pain, SOB, abdominal pain or chills. Denies recent travel or new exposures. Endorses recent sick contacts in visiting family and daughter who are currently sick as well. Patient has been taking Tylenol for fever with relief.  Past Medical History:  Diagnosis Date  . Asthma   . Heart murmur     There are no active problems to display for this patient.   Past Surgical History:  Procedure Laterality Date  . FACIAL COSMETIC SURGERY    . FRACTURE SURGERY    . WISDOM TOOTH EXTRACTION          Home Medications    Prior to Admission medications   Not on File    Family History Family History  Problem Relation Age of Onset  . Hypertension Mother   . Diabetes Mother   . Diabetes Father   . Hypertension Father     Social History Social History   Tobacco Use  . Smoking status: Current Every Day Smoker    Packs/day: 1.00    Types: Cigarettes  . Smokeless tobacco: Never Used  Substance Use Topics  . Alcohol use: No  . Drug use: Not Currently    Comment: marijuana daily     Allergies   Bee venom   Review of Systems Review of Systems  Constitutional: Positive for fever. Negative for appetite change and chills.  HENT: Positive for sore throat. Negative for congestion, postnasal drip, rhinorrhea, sinus pain, trouble swallowing and voice change.   Respiratory: Negative.   Cardiovascular: Negative.   Gastrointestinal: Negative.   Musculoskeletal: Positive for myalgias.  Neurological: Negative.      Physical Exam Updated Vital  Signs BP 115/74 (BP Location: Left Arm)   Pulse 99   Temp 99.7 F (37.6 C) (Oral)   Resp 18   Ht 5\' 11"  (1.803 m)   Wt 81.6 kg (180 lb)   SpO2 99%   BMI 25.10 kg/m   Physical Exam  Constitutional: Vital signs are normal. He appears well-developed and well-nourished. He does not appear ill.  HENT:  Right Ear: Hearing, tympanic membrane and ear canal normal.  Left Ear: Hearing, tympanic membrane and ear canal normal.  Mouth/Throat: Uvula is midline and mucous membranes are normal. No oropharyngeal exudate, posterior oropharyngeal edema, posterior oropharyngeal erythema or tonsillar abscesses. No tonsillar exudate.  Eyes: Pupils are equal, round, and reactive to light. EOM are normal.  Cardiovascular: Normal rate, regular rhythm and normal heart sounds.  Pulmonary/Chest: Effort normal and breath sounds normal.  Abdominal: Soft. Bowel sounds are normal. There is no tenderness.  Skin: Skin is warm and dry. Capillary refill takes less than 2 seconds.  Nursing note and vitals reviewed.    ED Treatments / Results  Labs (all labs ordered are listed, but only abnormal results are displayed) Labs Reviewed  BASIC METABOLIC PANEL - Abnormal; Notable for the following components:      Result Value   Glucose, Bld 101 (*)    All other components within normal limits  GROUP A STREP BY PCR  CBC WITH DIFFERENTIAL/PLATELET    EKG None  Radiology No results found.  Procedures Procedures (including critical care time)  Medications Ordered in ED Medications  acetaminophen (TYLENOL) tablet 650 mg (650 mg Oral Given 12/12/17 1735)     Initial Impression / Assessment and Plan / ED Course  Triage vital signs and the nursing notes have been reviewed.  Pertinent labs & imaging results that were available during care of the patient were reviewed and considered in medical decision making (see chart for details).   Patient presented febrile at 102.7 F, but fever broke with Tylenol given in  the ED. His remaining vitals were normal and he is well appearing. He has complaints of fever, body aches and sore throat. Physical exam was normal and strep test was negative which is reassuring. Remaining labs drawn are not indicative of an acute bacterial infection. Blood counts are normal and there are no electrolyte abnormalities. There are other household family members with similar complaints and illness whose symptoms have resolved in 2-3 days which points to viral etiology to illness. Patient will likely follow the same course.  Final Clinical Impressions(s) / ED Diagnoses  1. Viral Illness. Advised to follow-up with PCP. Education provided on OTC and supportive treatment for symptom relief.  Dispo: Home. After thorough clinical evaluation, this patient is determined to be medically stable and can be safely discharged with the previously mentioned treatment and/or outpatient follow-up/referral(s). At this time, there are no other apparent medical conditions that require further screening, evaluation or treatment.  Final diagnoses:  Viral pharyngitis    ED Discharge Orders    None        Reva Bores 12/12/17 2221    Little, Ambrose Finland, MD 12/12/17 [phone]

## 2017-12-12 NOTE — ED Notes (Signed)
Attempted to get blood but was unsuccessful 

## 2017-12-12 NOTE — ED Notes (Signed)
[employer]ent over [employer] instructions with Pt and son. Pt was agitated with wait. Apologized for Pt and son's wait. Pt started cussing and stated "you all did nothing here" Pt has no questions at discharge and both him and his son ambulated out of facility

## 2018-03-12 ENCOUNTER — Other Ambulatory Visit: Payer: Self-pay

## 2018-03-12 ENCOUNTER — Encounter (HOSPITAL_COMMUNITY): Payer: Self-pay

## 2018-03-12 ENCOUNTER — Emergency Department (HOSPITAL_COMMUNITY)
Admission: EM | Admit: 2018-03-12 | Discharge: 2018-03-12 | Disposition: A | Discharge status: Home / Self Care | Payer: Self-pay | Attending: Emergency Medicine | Admitting: Emergency Medicine

## 2018-03-12 DIAGNOSIS — J45909 Unspecified asthma, uncomplicated: Secondary | ICD-10-CM | POA: Insufficient documentation

## 2018-03-12 DIAGNOSIS — R112 Nausea with vomiting, unspecified: Secondary | ICD-10-CM | POA: Insufficient documentation

## 2018-03-12 DIAGNOSIS — R42 Dizziness and giddiness: Secondary | ICD-10-CM | POA: Insufficient documentation

## 2018-03-12 DIAGNOSIS — F17210 Nicotine dependence, cigarettes, uncomplicated: Secondary | ICD-10-CM | POA: Insufficient documentation

## 2018-03-12 DIAGNOSIS — R197 Diarrhea, unspecified: Secondary | ICD-10-CM | POA: Insufficient documentation

## 2018-03-12 HISTORY — DX: Unspecified asthma, uncomplicated: J45.909

## 2018-03-12 LAB — URINALYSIS, ROUTINE W REFLEX MICROSCOPIC
Bacteria, UA: NONE SEEN
Bilirubin Urine: NEGATIVE
Glucose, UA: NEGATIVE mg/dL
Hgb urine dipstick: NEGATIVE
KETONES UR: NEGATIVE mg/dL
Nitrite: NEGATIVE
PH: 5 (ref 5.0–8.0)
PROTEIN: NEGATIVE mg/dL
Specific Gravity, Urine: 1.023 (ref 1.005–1.030)

## 2018-03-12 LAB — COMPREHENSIVE METABOLIC PANEL
ALT: 21 U/L (ref 0–44)
AST: 22 U/L (ref 15–41)
Albumin: 4.6 g/dL (ref 3.5–5.0)
Alkaline Phosphatase: 73 U/L (ref 38–126)
Anion gap: 11 (ref 5–15)
BUN: 13 mg/dL (ref 6–20)
CHLORIDE: 105 mmol/L (ref 98–111)
CO2: 24 mmol/L (ref 22–32)
CREATININE: 0.97 mg/dL (ref 0.61–1.24)
Calcium: 9.6 mg/dL (ref 8.9–10.3)
GFR calc non Af Amer: >60 mL/min (ref >60)
Glucose, Bld: 112 mg/dL — ABNORMAL HIGH (ref 70–99)
Potassium: 3.9 mmol/L (ref 3.5–5.1)
SODIUM: 140 mmol/L (ref 135–145)
Total Bilirubin: 0.6 mg/dL (ref 0.3–1.2)
Total Protein: 8 g/dL (ref 6.5–8.1)

## 2018-03-12 LAB — CBC
HCT: 46.4 % (ref 39.0–52.0)
HEMOGLOBIN: 15 g/dL (ref 13.0–17.0)
MCH: 28.5 pg (ref 26.0–34.0)
MCHC: 32.3 g/dL (ref 30.0–36.0)
MCV: 88.2 fL (ref 80.0–100.0)
NRBC: 0 % (ref 0.0–0.2)
Platelets: 243 10*3/uL (ref 150–400)
RBC: 5.26 MIL/uL (ref 4.22–5.81)
RDW: 14 % (ref 11.5–15.5)
WBC: 6.9 10*3/uL (ref 4.0–10.5)

## 2018-03-12 LAB — LIPASE, BLOOD: LIPASE: 39 U/L (ref 11–51)

## 2018-03-12 MED ORDER — ONDANSETRON 4 MG PO TBDP: 4.0000 mg | ORAL_TABLET | Freq: Three times a day (TID) | ORAL | 0 refills | Status: AC | PRN

## 2018-03-12 NOTE — ED Provider Notes (Signed)
[facility]-EMERGENCY DEPT Provider Note  CSN: [id number] Arrival date & time: 03/12/18 0344  Chief Complaint(s) N/V/D and Dizzy spells  HPI Evan Wiley is a 37 y.o. male who presents with 2 days of nausea, nonbloody nonbilious emesis, and watery diarrhea with intermittent lower abdominal cramping prior to bowel movements.  Patient also endorsing lightheadedness.  States that he is able to tolerate oral intake if he has small portions.  Reports positive sick contacts at home with similar symptoms.  Denies any known suspicious food intake, recent travel, recent antibiotic use.  No fevers or chills.  Denies any chest pain or shortness of breath.  No urinary symptoms.  HPI  Past Medical History Past Medical History:  Diagnosis Date  . Asthma   . Heart murmur   . Reactive airway disease    There are no active problems to display for this patient.  Home Medication(s) Prior to Admission medications   Medication Sig Start Date End Date Taking? Authorizing Provider  acetaminophen (TYLENOL) 500 MG tablet Take 1,000 mg by mouth every 6 (six) hours as needed for moderate pain.   Yes [provider]  Cyanocobalamin (VITAMIN B-12 CR PO) Take 1 tablet by mouth daily.   Yes [provider]  GOLDEN SEAL PO Take 2 tablets by mouth daily.   Yes [provider]  naproxen sodium (ALEVE) 220 MG tablet Take 440 mg by mouth 2 (two) times daily as needed (pain).   Yes [provider]  ondansetron (ZOFRAN ODT) 4 MG disintegrating tablet Take 1 tablet (4 mg total) by mouth every 8 (eight) hours as needed for up to 3 days for nausea or vomiting. 03/12/18 03/15/18  Nira Conn, MD                                                                                                                                    Past Surgical History Past Surgical History:  Procedure Laterality Date  . FACIAL COSMETIC SURGERY    . FRACTURE SURGERY    .  WISDOM TOOTH EXTRACTION     Family History Family History  Problem Relation Age of Onset  . Hypertension Mother   . Diabetes Mother   . Diabetes Father   . Hypertension Father     Social History Social History   Tobacco Use  . Smoking status: Current Every Day Smoker    Packs/day: 1.00    Types: Cigarettes  . Smokeless tobacco: Never Used  Substance Use Topics  . Alcohol use: Yes  . Drug use: Not Currently    Comment: marijuana daily   Allergies Bee venom  Review of Systems Review of Systems All other systems are reviewed and are negative for acute change except as noted in the HPI  Physical Exam Vital Signs  I have reviewed the triage vital signs BP (!) 150/88   Pulse 77   Temp 98.1 F (36.7  C)   Resp 17   Ht 5\' 11"  (1.803 m)   Wt 81.6 kg   SpO2 96%   BMI 25.10 kg/m   Physical Exam  Constitutional: He is oriented to person, place, and time. He appears well-developed and well-nourished. No distress.  HENT:  Head: Normocephalic and atraumatic.  Right Ear: External ear normal.  Left Ear: External ear normal.  Nose: Nose normal.  Mouth/Throat: Mucous membranes are normal. No trismus in the jaw.  Eyes: Conjunctivae and EOM are normal. No scleral icterus.  Neck: Normal range of motion and phonation normal.  Cardiovascular: Normal rate and regular rhythm.  Pulmonary/Chest: Effort normal. No stridor. No respiratory distress.  Abdominal: He exhibits no distension. There is no tenderness. There is no rigidity, no rebound and no guarding.  Musculoskeletal: Normal range of motion. He exhibits no edema.  Neurological: He is alert and oriented to person, place, and time.  Skin: He is not diaphoretic.  Psychiatric: He has a normal mood and affect. His behavior is normal.  Vitals reviewed.   ED Results and Treatments Labs (all labs ordered are listed, but only abnormal results are displayed) Labs Reviewed  COMPREHENSIVE METABOLIC PANEL - Abnormal; Notable for the  following components:      Result Value   Glucose, Bld 112 (*)    All other components within normal limits  URINALYSIS, ROUTINE W REFLEX MICROSCOPIC - Abnormal; Notable for the following components:   Leukocytes, UA TRACE (*)    All other components within normal limits  LIPASE, BLOOD  CBC                                                                                                                         EKG  EKG Interpretation  Date/Time:    Ventricular Rate:    PR Interval:    QRS Duration:   QT Interval:    QTC Calculation:   R Axis:     Text Interpretation:        Radiology No results found. Pertinent labs & imaging results that were available during my care of the patient were reviewed by me and considered in my medical decision making (see chart for details).  Medications Ordered in ED Medications - No data to display  Procedures Procedures  (including critical care time)  Medical Decision Making / ED Course I have reviewed the nursing notes for this encounter and the patient's prior records (if available in EHR or on provided paperwork).    37 y.o. male presents with vomiting, diarrhea for 2 days. No possible suspicious food intake. Marland Kitchen decreased oral tolerance. Rest of history as above.  Patient appears well, not in distress, and with no signs of toxicity or dehydration. Abdomen benign.  Rest of the exam as above  Labs drawn at triage were grossly reassuring without leukocytosis or anemia.  No significant electrolyte derangements or renal insufficiency.  No evidence of biliary obstruction or pancreatitis.  Most consistent with viral gastroenteritis.   Doubt appendicitis, diverticulitis, severe colitis, dysentery.    Able to tolerate oral intake in the ED.  Discussed symptomatic treatment with the patient and they will follow  closely with their PCP.   Final Clinical Impression(s) / ED Diagnoses Final diagnoses:  Nausea vomiting and diarrhea  Lightheadedness   Disposition: Discharge  Condition: Good  I have discussed the results, Dx and Tx plan with the patient who expressed understanding and agree(s) with the plan. Discharge instructions discussed at great length. The patient was given strict return precautions who verbalized understanding of the instructions. No further questions at time of discharge.    ED Discharge Orders         Ordered    ondansetron (ZOFRAN ODT) 4 MG disintegrating tablet  Every 8 hours PRN     03/12/18 0554           Follow Up: Primary care provider   If you do not have a primary care physician, contact [facility] at [phone] for referral      This chart was dictated using voice recognition software.  Despite best efforts to proofread,  errors can occur which can change the documentation meaning.   Nira Conn, MD 03/12/18 [phone]

## 2018-03-12 NOTE — ED Notes (Signed)
Bed: [facility]3 Expected date:  Expected time:  Means of arrival:  Comments: 

## 2018-03-12 NOTE — ED Triage Notes (Signed)
Patient states he has been having N/V/D since yesterday AM after he woke up. Patient states he has been having lower abdominal pain. Patient states he also has been having dizzy spells since yesterday. Patient states he has vomited 4-5 times-states if he eats his food slow he doesn't vomit, if he eats fast, he vomits. Patient states numerous diarrhea stools.

## 2018-03-14 ENCOUNTER — Emergency Department (HOSPITAL_COMMUNITY)
Admission: EM | Admit: 2018-03-14 | Discharge: 2018-03-15 | Disposition: A | Discharge status: Home / Self Care | Payer: Self-pay | Attending: Emergency Medicine | Admitting: Emergency Medicine

## 2018-03-14 ENCOUNTER — Encounter (HOSPITAL_COMMUNITY): Payer: Self-pay

## 2018-03-14 ENCOUNTER — Other Ambulatory Visit: Payer: Self-pay

## 2018-03-14 DIAGNOSIS — R197 Diarrhea, unspecified: Secondary | ICD-10-CM | POA: Insufficient documentation

## 2018-03-14 DIAGNOSIS — Z79899 Other long term (current) drug therapy: Secondary | ICD-10-CM | POA: Insufficient documentation

## 2018-03-14 DIAGNOSIS — J45909 Unspecified asthma, uncomplicated: Secondary | ICD-10-CM | POA: Insufficient documentation

## 2018-03-14 DIAGNOSIS — F17210 Nicotine dependence, cigarettes, uncomplicated: Secondary | ICD-10-CM | POA: Insufficient documentation

## 2018-03-14 NOTE — ED Notes (Signed)
Pt requested that I not put in the standing lab orders.

## 2018-03-14 NOTE — ED Triage Notes (Signed)
Pt reports that he needs a work note to be able to take a day off d/t his stomach virus. He states that he is experiencing N/VD and abdominal pain and headache, but he doesn't want to be seen for that. He states that he knows he just needs to rest at home, but needs a note.

## 2018-03-15 NOTE — ED Provider Notes (Signed)
[facility]   CSN: [id number] Arrival date & time: 03/14/18  2238     History   Chief Complaint Chief Complaint  Patient presents with  . Letter for School/Work    HPI Evan Wiley is a 37 y.o. male.  The history is provided by the patient.  Patient with history of asthma presents for a work note.  He reports he has had nausea vomiting and diarrhea for the past several days.  He reports he feels he has a viral infection. He was seen in the ER on November 5 for the symptoms He had labs done, was diagnosed with gastroenteritis.  He reports the vomiting is improved, but he still has nonbloody diarrhea.  He reports he needs 1 day off of work just so he can get better.  He does not feel comfortable going to the restroom at work.  He also reports abdominal pain.  He has not been taking any Imodium.  No fevers. Past Medical History:  Diagnosis Date  . Asthma   . Heart murmur   . Reactive airway disease     There are no active problems to display for this patient.   Past Surgical History:  Procedure Laterality Date  . FACIAL COSMETIC SURGERY    . FRACTURE SURGERY    . WISDOM TOOTH EXTRACTION          Home Medications    Prior to Admission medications   Medication Sig Start Date End Date Taking? Authorizing Provider  acetaminophen (TYLENOL) 500 MG tablet Take 1,000 mg by mouth every 6 (six) hours as needed for moderate pain.    [provider]  Cyanocobalamin (VITAMIN B-12 CR PO) Take 1 tablet by mouth daily.    [provider]  GOLDEN SEAL PO Take 2 tablets by mouth daily.    [provider]  naproxen sodium (ALEVE) 220 MG tablet Take 440 mg by mouth 2 (two) times daily as needed (pain).    [provider]  ondansetron (ZOFRAN ODT) 4 MG disintegrating tablet Take 1 tablet (4 mg total) by mouth every 8 (eight) hours as needed for up to 3 days for nausea or vomiting. 03/12/18 03/15/18   Nira Conn, MD    Family History Family History  Problem Relation Age of Onset  . Hypertension Mother   . Diabetes Mother   . Diabetes Father   . Hypertension Father     Social History Social History   Tobacco Use  . Smoking status: Current Every Day Smoker    Packs/day: 1.00    Types: Cigarettes  . Smokeless tobacco: Never Used  Substance Use Topics  . Alcohol use: Yes  . Drug use: Not Currently    Comment: marijuana daily     Allergies   Bee venom   Review of Systems Review of Systems  Constitutional: Negative for fever.  Gastrointestinal: Positive for diarrhea. Negative for blood in stool.     Physical Exam Updated Vital Signs BP (!) 122/99 (BP Location: Right Arm)   Pulse 98   Temp 98.2 F (36.8 C) (Oral)   Resp 18   Ht 1.803 m (5\' 11" )   Wt 81.6 kg   SpO2 97%   BMI 25.10 kg/m   Physical Exam  CONSTITUTIONAL: Well developed/well nourished HEAD: Normocephalic/atraumatic EYES: EOMI ENMT: Mucous membranes moist NECK: supple no meningeal signs CV: S1/S2 noted, no murmurs/rubs/gallops noted LUNGS: Lungs are clear to auscultation bilaterally, no apparent distress ABDOMEN: soft,  nontender NEURO: Pt is awake/alert/appropriate, moves all extremitiesx4.  No facial droop.   EXTREMITIES:full ROM SKIN: warm, color normal PSYCH: Anxious  ED Treatments / Results  Labs (all labs ordered are listed, but only abnormal results are displayed) Labs Reviewed - No data to display  EKG None  Radiology No results found.  Procedures Procedures (including critical care time)  Medications Ordered in ED Medications - No data to display   Initial Impression / Assessment and Plan / ED Course  I have reviewed the triage vital signs and the nursing notes.   Requesting a work note for his viral illness.  He is in no acute distress.  Vitals are appropriate.  Will discharge home  Final Clinical Impressions(s) / ED Diagnoses   Final diagnoses:    Diarrhea of presumed infectious origin    ED Discharge Orders    None       Zadie Rhine, MD 03/15/18 [phone]

## 2019-08-27 ENCOUNTER — Other Ambulatory Visit: Payer: Self-pay

## 2019-08-27 ENCOUNTER — Encounter (HOSPITAL_COMMUNITY): Payer: Self-pay

## 2019-08-27 ENCOUNTER — Emergency Department (HOSPITAL_COMMUNITY): Payer: Self-pay

## 2019-08-27 ENCOUNTER — Emergency Department (HOSPITAL_COMMUNITY)
Admission: EM | Admit: 2019-08-27 | Discharge: 2019-08-27 | Disposition: A | Discharge status: Home / Self Care | Payer: Self-pay | Attending: Emergency Medicine | Admitting: Emergency Medicine

## 2019-08-27 DIAGNOSIS — F17210 Nicotine dependence, cigarettes, uncomplicated: Secondary | ICD-10-CM | POA: Insufficient documentation

## 2019-08-27 DIAGNOSIS — M25572 Pain in left ankle and joints of left foot: Secondary | ICD-10-CM | POA: Insufficient documentation

## 2019-08-27 DIAGNOSIS — J45909 Unspecified asthma, uncomplicated: Secondary | ICD-10-CM | POA: Insufficient documentation

## 2019-08-27 DIAGNOSIS — Z79899 Other long term (current) drug therapy: Secondary | ICD-10-CM | POA: Insufficient documentation

## 2019-08-27 MED ORDER — OXYCODONE-ACETAMINOPHEN 5-325 MG PO TABS
1.0000 | ORAL_TABLET | Freq: Once | ORAL | Status: AC
  Administered 2019-08-27: 1 via ORAL
  Filled 2019-08-27: qty 1

## 2019-08-27 MED ORDER — HYDROCODONE-ACETAMINOPHEN 5-325 MG PO TABS: 1.0000 | ORAL_TABLET | Freq: Four times a day (QID) | ORAL | 0 refills | Status: DC | PRN

## 2019-08-27 MED ORDER — OXYCODONE-ACETAMINOPHEN 5-325 MG PO TABS
1.0000 | ORAL_TABLET | Freq: Once | ORAL | Status: AC
Start: 2019-08-27 — End: 2019-08-27
  Administered 2019-08-27: 1 via ORAL
  Filled 2019-08-27: qty 1

## 2019-08-27 NOTE — ED Notes (Signed)
Paged ortho tech from [facility]@03 :12am.

## 2019-08-27 NOTE — ED Provider Notes (Signed)
[facility] DEPT Provider Note   CSN: [id number] Arrival date & time: 08/27/19  0051     History Chief Complaint  Patient presents with  . Ankle Pain    Evan Wiley is a 39 y.o. male past medical history of asthma, heart murmur who presents for evaluation of left ankle pain.  Patient reports that he was getting ready to run and states that he pushed off when he heard a pop in his left ankle.  Since then has had pain to the left ankle.  He reports he took ibuprofen and Aleve with no improvement in symptoms.  He has had difficulty ambulating and bearing weight since the incident.  He reports he previously broke that ankle about 3 years ago.  He states he never followed up with orthopedics.  He denies any numbness/weakness.  The history is provided by the patient.       Past Medical History:  Diagnosis Date  . Asthma   . Heart murmur   . Reactive airway disease     There are no problems to display for this patient.   Past Surgical History:  Procedure Laterality Date  . FACIAL COSMETIC SURGERY    . FRACTURE SURGERY    . WISDOM TOOTH EXTRACTION         Family History  Problem Relation Age of Onset  . Hypertension Mother   . Diabetes Mother   . Diabetes Father   . Hypertension Father     Social History   Tobacco Use  . Smoking status: Current Every Day Smoker    Packs/day: 1.00    Types: Cigarettes  . Smokeless tobacco: Never Used  Substance Use Topics  . Alcohol use: Yes  . Drug use: Not Currently    Comment: marijuana daily    Home Medications Prior to Admission medications   Medication Sig Start Date End Date Taking? Authorizing Provider  acetaminophen (TYLENOL) 500 MG tablet Take 1,000 mg by mouth every 6 (six) hours as needed for moderate pain.    [provider]  Cyanocobalamin (VITAMIN B-12 CR PO) Take 1 tablet by mouth daily.    [provider]  GOLDEN SEAL PO Take 2 tablets by mouth daily.     [provider]  HYDROcodone-acetaminophen (NORCO/VICODIN) 5-325 MG tablet Take 1-2 tablets by mouth every 6 (six) hours as needed. 08/27/19   Volanda Napoleon, PA-C  naproxen sodium (ALEVE) 220 MG tablet Take 440 mg by mouth 2 (two) times daily as needed (pain).    [provider]    Allergies    Bee venom  Review of Systems   Review of Systems  Musculoskeletal:       Left ankle pain  Neurological: Negative for weakness and numbness.  All other systems reviewed and are negative.   Physical Exam Updated Vital Signs BP 122/78 (BP Location: Right Arm)   Pulse 89   Temp 98 F (36.7 C) (Oral)   Resp 16   Ht 5\' 11"  (1.803 m)   Wt 99.8 kg   SpO2 97%   BMI 30.68 kg/m   Physical Exam Vitals and nursing note reviewed.  Constitutional:      Appearance: He is well-developed.  HENT:     Head: Normocephalic and atraumatic.  Eyes:     General: No scleral icterus.       Right eye: No discharge.        Left eye: No discharge.     Conjunctiva/sclera: Conjunctivae  normal.  Cardiovascular:     Pulses:          Dorsalis pedis pulses are 2+ on the right side and 2+ on the left side.  Pulmonary:     Effort: Pulmonary effort is normal.  Musculoskeletal:     Comments: Swelling noted to the posterior left ankle.  No obvious palpable deficit but does appear to have ecchymosis and swelling noted to where the Achilles tendon is.  Difficulty with dorsiflexion and plantarflexion.  He can wiggle his toes but cannot fully dorsiflex or plantarflex the left foot.  No bony tenderness noted to the dorsal aspect of the foot.  He does have some tenderness noted to the left ankle at the lateral malleolus.  Positive Thompson test.  Skin:    General: Skin is warm and dry.     Comments: Good distal cap refill. LLE is not dusky in appearance or cool to touch.  Neurological:     Mental Status: He is alert.  Psychiatric:        Speech: Speech normal.        Behavior: Behavior normal.      ED Results / Procedures / Treatments   Labs (all labs ordered are listed, but only abnormal results are displayed) Labs Reviewed - No data to display  EKG None  Radiology DG Ankle Complete Left  Result Date: 08/27/2019 CLINICAL DATA:  Ankle pain, heard pop while running. EXAM: LEFT ANKLE COMPLETE - 3+ VIEW COMPARISON:  12/06/2010 FINDINGS: Old healed distal fibular fracture. No acute fracture, subluxation or dislocation. Joint spaces are maintained. Soft tissues are intact. IMPRESSION: No acute bony abnormality. Electronically Signed   By: Charlett Nose M.D.   On: 08/27/2019 02:08    Procedures Procedures (including critical care time)  Medications Ordered in ED Medications  oxyCODONE-acetaminophen (PERCOCET/ROXICET) 5-325 MG per tablet 1 tablet (1 tablet Oral Given 08/27/19 0151)  oxyCODONE-acetaminophen (PERCOCET/ROXICET) 5-325 MG per tablet 1 tablet (1 tablet Oral Given 08/27/19 7829)    ED Course  I have reviewed the triage vital signs and the nursing notes.  Pertinent labs & imaging results that were available during my care of the patient were reviewed by me and considered in my medical decision making (see chart for details).    MDM Rules/Calculators/A&P                      39 year old male who presents for evaluation of left ankle pain.  Reports that he was getting ready to run today and states that when he pushed off, he heard a pop in his left ankle.  Since then has had pain, swelling to the ankle and difficulty ambulating and bearing weight.  No numbness/weakness.  On initially arrival, he is afebrile, nontoxic-appearing.  Vital signs are stable.  He is neurovascularly intact.  He does have tenderness noted to posterior aspect of the left ankle and swelling noted to the Achilles tendon.  Positive Thompson test.  Difficulty with plantarflexion and dorsiflexion.  Concern for possible Achilles tendon rupture/tear.  Also consider fracture though lower suspicion given  mechanism of injury.  Plan for x-ray, analgesics.  X-rays reviewed.  Negative for any acute bony abnormality.  Reevaluation after pain medication.  He still has a positive Thompson test.  He can wiggle his toes and the distal aspect of his foot but cannot fully dorsiflex or plantar flex.  I do not feel any palpable deficit noted.  Question if he has a partial Achilles tendon  tear.  Will consult Ortho.  I discussed with Dr. Ophelia Charter (Ortho).  He recommends posterior splint close to 90 degrees and crutches with nonweightbearing status.  He will plan to see patient in outpatient follow-up.  Reevaluation after splint placement.  Patient with good distal cap refill and sensation.  Instructed patient to follow-up with Ortho as directed.  I discussed with him that he could possibly have an Achilles tendon injury and that Ortho follow-up is important.  Patient is agreeable.  Will give a short course of pain medication for acute pain.  Patient reviewed on PMP. At this time, patient exhibits no emergent life-threatening condition that require further evaluation in ED or admission. Patient had ample opportunity for questions and discussion. All patient's questions were answered with full understanding. Strict return precautions discussed. Patient expresses understanding and agreement to plan.   Portions of this note were generated with Scientist, clinical (histocompatibility and immunogenetics). Dictation errors may occur despite best attempts at proofreading.  Final Clinical Impression(s) / ED Diagnoses Final diagnoses:  Acute left ankle pain    Rx / DC Orders ED Discharge Orders         Ordered    HYDROcodone-acetaminophen (NORCO/VICODIN) 5-325 MG tablet  Every 6 hours PRN     08/27/19 0446           Maxwell Caul, PA-C 08/27/19 7416    Shon Baton, MD 08/27/19 [phone]

## 2019-08-27 NOTE — ED Notes (Signed)
Pt verbalized d/c instructions and follow up care. Alert and assisted into visitors car. No iv. Demonstrated proper use of crutches.

## 2019-08-27 NOTE — Discharge Instructions (Signed)
You can take Tylenol or Ibuprofen as directed for pain. You can alternate Tylenol and Ibuprofen every 4 hours. If you take Tylenol at 1pm, then you can take Ibuprofen at 5pm. Then you can take Tylenol again at 9pm.   Take pain medications as directed for break through pain. Do not drive or operate machinery while taking this medication.   Elevate the leg to help with swelling.   Use crutches.  Call Dr. Marlene Bast office tomorrow to arrange for an appointment.  Return emergency department for any worsening pain, discoloration of your toes, numbness/weakness or any other worsening or concerning symptoms.

## 2019-08-27 NOTE — ED Triage Notes (Signed)
Patient arrived stating while running today he heard a pop. Pain in his left ankle. 3 ibuprofen and 4 aleve throughout the day with no relief.

## 2019-08-27 NOTE — Progress Notes (Signed)
Orthopedic Tech Progress Note Patient Details:  Evan Wiley 1980-06-17 [id number]  Ortho Devices Type of Ortho Device: Short leg splint Ortho Device/Splint Location: LLE Ortho Device/Splint Interventions: Application   Post Interventions Patient Tolerated: Well Instructions Provided: Care of device   [name] 08/27/2019, 3:38 AM

## 2019-09-02 ENCOUNTER — Ambulatory Visit: Payer: PRIVATE HEALTH INSURANCE | Admitting: Orthopaedic Surgery

## 2019-09-19 ENCOUNTER — Encounter: Payer: Self-pay | Admitting: Orthopaedic Surgery

## 2019-09-19 ENCOUNTER — Ambulatory Visit (INDEPENDENT_AMBULATORY_CARE_PROVIDER_SITE_OTHER): Payer: Self-pay | Admitting: Orthopaedic Surgery

## 2019-09-19 ENCOUNTER — Other Ambulatory Visit: Payer: Self-pay

## 2019-09-19 ENCOUNTER — Telehealth: Payer: Self-pay | Admitting: Radiology

## 2019-09-19 ENCOUNTER — Ambulatory Visit: Payer: Self-pay

## 2019-09-19 VITALS — BP 117/87 | HR 80 | Ht 71.0 in | Wt 220.0 lb

## 2019-09-19 DIAGNOSIS — M79662 Pain in left lower leg: Secondary | ICD-10-CM

## 2019-09-19 DIAGNOSIS — S86012A Strain of left Achilles tendon, initial encounter: Secondary | ICD-10-CM

## 2019-09-19 DIAGNOSIS — M25572 Pain in left ankle and joints of left foot: Secondary | ICD-10-CM

## 2019-09-19 NOTE — Progress Notes (Signed)
Limited diagnostic ultrasound left Achilles: The attachment on the calcaneus is intact.  Moving proximally, there is swelling of the tendon with hypoechoic changes.  About 7 to 8 cm proximal to the calcaneus there is a high-grade tear.  The ends of the tendon are about 1 cm apart.  I do not think there is continuity with dynamic imaging.

## 2019-09-19 NOTE — Telephone Encounter (Signed)
Christy from [facility] is aware of STAT doppler order and will call patient first thing Monday morning.  Patient advised he could go to hospital for doppler at this time. If not Dr. Ophelia Charter informed he did need to be seen by Monday.

## 2019-09-19 NOTE — Progress Notes (Signed)
Office Visit Note   Patient: Evan Wiley           Date of Birth: [birth year]           MRN: [id number] Visit Date: 09/19/2019              Requested by: No referring provider defined for this encounter. PCP: Patient, No Pcp Per   Assessment & Plan: Visit Diagnoses:  1. Pain in left ankle and joints of left foot   2. Pain of left calf   3. Rupture of left Achilles tendon, initial encounter     Plan: Patient advised that with his injury he will be needing surgical intervention with left Achilles tendon repair.  Advised patient that this is definitely an injury that should be addressed sooner than later with surgical intervention.  We will save up with him on the schedule for next Wednesday.  Patient was sent upstairs to see Dr. Prince Rome for diagnostic ultrasound.  Patient is also been having increased left calf pain.  Recommend that he get a stat venous Doppler to rule out DVT.  Advised patient that vascular labs are close with the evening and there is recommended that he go to the emergency room to have this done.  If he does not go to the emergency room Dr. Ophelia Charter said it is okay to schedule him  an appointment for venous Doppler Monday.  Patient understands the risk of having a DVT that can lead to a pulmonary embolism and death.  Surgical procedure discussed along with potential recovery time.  Dr. Ophelia Charter also discussed the need for surgery with patient today.  All questions answered.  He was given a cam boot as well.  Follow-Up Instructions: No follow-ups on file.   Orders:  Orders Placed This Encounter  Procedures  . XR Ankle Complete Left  . VAS [place] LOWER EXTREMITY VENOUS (DVT)   No orders of the defined types were placed in this encounter.     Procedures: No procedures performed   Clinical Data: No additional findings.   Subjective: Chief Complaint  Patient presents with  . Left Ankle - Pain    HPI 39 year old black male comes in today with complaints of  left ankle pain. August 27, 2019 was playing basketball when he planted and took off to run he felt a "pop" in the back of his lower leg.  He was unable to ambulate immediately after this.  He did go to the emergency room that evening where he was diagnosed with a possible Achilles tendon rupture.  Patient was put in a splint and advised to follow-up in our office but he had canceled earlier appointment that was scheduled and has since taken the splint off and and ambulating with crutches. He continues complain of pain at the Achilles tendon and he is also having pain in the calf as well.  No complaints of chest pain,  shortness of breath. Review of Systems No current cardiac pulmonary GI GU issues  Objective: Vital Signs: BP 117/87 (BP Location: Left Arm, Patient Position: Sitting, Cuff Size: Normal)   Pulse 80   Ht 5\' 11"  (1.803 m)   Wt 220 lb (99.8 kg)   BMI 30.68 kg/m   Physical Exam Constitutional:      General: He is not in acute distress. HENT:     Head: Normocephalic and atraumatic.  Eyes:     Extraocular Movements: Extraocular movements intact.     Pupils: Pupils are equal, round, and  reactive to light.  Pulmonary:     Effort: No respiratory distress.  Musculoskeletal:     Comments: Patient has moderate to marked left calf tenderness.  Patient is has marked tenderness over the Achilles tendon where there is a palpable defect around the watershed.  Positive Thompson test.  Neurovascular intact.  Neurological:     General: No focal deficit present.     Mental Status: He is alert and oriented to person, place, and time.     Ortho Exam  Specialty Comments:  No specialty comments available.  Imaging: No results found.   PMFS History: There are no problems to display for this patient.  Past Medical History:  Diagnosis Date  . Asthma   . Heart murmur   . Reactive airway disease     Family History  Problem Relation Age of Onset  . Hypertension Mother   . Diabetes  Mother   . Diabetes Father   . Hypertension Father     Past Surgical History:  Procedure Laterality Date  . FACIAL COSMETIC SURGERY    . FRACTURE SURGERY    . WISDOM TOOTH EXTRACTION     Social History   Occupational History  . Not on file  Tobacco Use  . Smoking status: Current Every Day Smoker    Packs/day: 1.00    Types: Cigarettes  . Smokeless tobacco: Never Used  Substance and Sexual Activity  . Alcohol use: Yes  . Drug use: Not Currently    Comment: marijuana daily  . Sexual activity: Yes    Birth control/protection: None

## 2019-09-20 ENCOUNTER — Other Ambulatory Visit (HOSPITAL_COMMUNITY)
Admission: RE | Admit: 2019-09-20 | Discharge: 2019-09-20 | Disposition: A | Discharge status: Home / Self Care | Payer: Self-pay | Source: Ambulatory Visit | Attending: Orthopaedic Surgery | Admitting: Orthopaedic Surgery

## 2019-09-20 DIAGNOSIS — Z20822 Contact with and (suspected) exposure to covid-19: Secondary | ICD-10-CM | POA: Insufficient documentation

## 2019-09-20 DIAGNOSIS — Z01812 Encounter for preprocedural laboratory examination: Secondary | ICD-10-CM | POA: Insufficient documentation

## 2019-09-20 LAB — SARS CORONAVIRUS 2 (TAT 6-24 HRS): SARS Coronavirus 2: NEGATIVE

## 2019-09-22 ENCOUNTER — Encounter (HOSPITAL_COMMUNITY): Payer: Self-pay | Admitting: Orthopaedic Surgery

## 2019-09-22 NOTE — Progress Notes (Signed)
Patient denies shortness of breath, fever, cough or chest pain.  PCP - None Cardiologist -n/a  Chest x-ray - n/a EKG - n/a Stress Test - n/a ECHO - n/a Cardiac Cath - n/a  Anesthesia review: Yes - Heart Murmur  STOP now taking any Aspirin (unless otherwise instructed by your surgeon), Aleve, Naproxen, Ibuprofen, Motrin, Advil, Goody's, BC's, all herbal medications, fish oil, and all vitamins.   Coronavirus Screening Covid test on 09/20/19 was negative.  Patient verbalized understanding of instructions that were given

## 2019-09-23 ENCOUNTER — Telehealth: Payer: Self-pay | Admitting: Surgery

## 2019-09-23 ENCOUNTER — Telehealth (INDEPENDENT_AMBULATORY_CARE_PROVIDER_SITE_OTHER): Payer: PRIVATE HEALTH INSURANCE | Admitting: Surgery

## 2019-09-23 ENCOUNTER — Other Ambulatory Visit: Payer: Self-pay

## 2019-09-23 ENCOUNTER — Telehealth: Payer: Self-pay | Admitting: Radiology

## 2019-09-23 ENCOUNTER — Encounter (HOSPITAL_COMMUNITY): Payer: Self-pay

## 2019-09-23 DIAGNOSIS — M25572 Pain in left ankle and joints of left foot: Secondary | ICD-10-CM

## 2019-09-23 NOTE — Telephone Encounter (Signed)
  This morning had a phone call with Evan Wiley in regards to venous Doppler study.  Patient was seen by me Sep 19, 2019 for left Achilles tendon rupture.  At that visit I advised patient that I was concerned about the increased calf pain that he was having and I felt that he needed a stat venous Doppler.  At that time all of the vascular labs were close since it was late in the afternoon.  Advised patient that he can go to the emergency room to have the study done to rule out DVT.  This was not done over the weekend.  As of the time of this dictation patient has not had venous Doppler study.  I was advised that the vascular lab offered multiple appointment times for him to have this done this week but these appointments were declined.  Patient is scheduled for Achilles tendon repair late Wednesday afternoon and he has venous Doppler appointment scheduled tomorrow at 11 AM.  I did advise again that I recommend the venous Doppler study be done as soon as possible to rule out the possibility of DVT.  Again I made him aware of the fact that if he does have a DVT that this could lead to death.  Our assistant was able to help get him an appointment today at 1245 [facility] and I advised patient of this but he stated that he has transportation issues and cannot make that appointment.  He also stated that he had transportation issues that prevented him from having the study done yesterday.  He assured me that he would proceed with having the study done tomorrow morning.  He understands that if he does not have the study that this could complicate proceeding with his surgery tomorrow afternoon.

## 2019-09-23 NOTE — Telephone Encounter (Signed)
Zonia Kief, PA-C, requested I call [facility]. To have patients doppler appointment for 5/19 at 11am moved to today as an urgent request. Patient is scheduled for surgery on 5/19 and doppler needs to be done today.  Spoke with Evan Wiley again about patient having him moved. She will work him in today at 12:45 for a 1pm appointment.  Evan Wiley did advise and note that she did offer the patient multiple appointment times prior to 5/19 and patient refused all stating "he could not do that". Evan Wiley stated to call her back if she needs to adjust this appointment again she is happy to help.  Zonia Kief, PA-C, was given appointment time for today and will call patient informing him we have made this appointment and the importance of having it done today.    [name] did call patient, patient still refused the appointment for today for his doppler. Even aware of risks. Patient stated he would be at his original appointment tomorrow 5/19 arriving at 10:45am.  I spoke with Evan Wiley again from [facility] she opened patients original appointment up again.

## 2019-09-23 NOTE — Anesthesia Preprocedure Evaluation (Addendum)
Anesthesia Evaluation  Patient identified by MRN, date of birth, ID band Patient awake    Reviewed: Allergy & Precautions, NPO status , Patient's Chart, lab work & pertinent test results  History of Anesthesia Complications Negative for: history of anesthetic complications  Airway Mallampati: II  TM Distance: >3 FB Neck ROM: Full    Dental  (+) Dental Advisory Given, Chipped   Pulmonary COPD, Current Smoker and Patient abstained from smoking.,  09/20/2019 SARS coronavirus NEG   breath sounds clear to auscultation       Cardiovascular negative cardio ROS   Rhythm:Regular Rate:Normal     Neuro/Psych Bipolar Disorder Schizophrenia negative neurological ROS     GI/Hepatic negative GI ROS, Neg liver ROS,   Endo/Other  negative endocrine ROS  Renal/GU negative Renal ROS     Musculoskeletal   Abdominal   Peds  Hematology negative hematology ROS (+)   Anesthesia Other Findings   Reproductive/Obstetrics                           Anesthesia Physical Anesthesia Plan  ASA: II  Anesthesia Plan: General   Post-op Pain Management: GA combined w/ Regional for post-op pain   Induction: Intravenous  PONV Risk Score and Plan: 2 and Ondansetron and Dexamethasone  Airway Management Planned: LMA  Additional Equipment: None  Intra-op Plan:   Post-operative Plan:   Informed Consent: I have reviewed the patients History and Physical, chart, labs and discussed the procedure including the risks, benefits and alternatives for the proposed anesthesia with the patient or authorized representative who has indicated his/her understanding and acceptance.     Dental advisory given  Plan Discussed with:   Anesthesia Plan Comments: (Reported hx of childhood murmur, review of notes in Epic shows multiple documented exams explicitly stating no murmur. Will need DOS eval. Pt is also scheduled for LE [place] on DOS  to rule out DVT.   Plan routine monitors, GA with adductor canal and popliteal blocks for post op analgesia)      Anesthesia Quick Evaluation

## 2019-09-24 ENCOUNTER — Observation Stay (HOSPITAL_COMMUNITY)
Admission: RE | Admit: 2019-09-24 | Discharge: 2019-09-25 | Disposition: A | Discharge status: Home / Self Care | Payer: No Typology Code available for payment source | Attending: Orthopaedic Surgery | Admitting: Orthopaedic Surgery

## 2019-09-24 ENCOUNTER — Encounter (HOSPITAL_COMMUNITY)
Admission: RE | Disposition: A | Discharge status: Home / Self Care | Payer: Self-pay | Source: Home / Self Care | Attending: Orthopaedic Surgery

## 2019-09-24 ENCOUNTER — Encounter (HOSPITAL_COMMUNITY): Payer: Self-pay

## 2019-09-24 ENCOUNTER — Ambulatory Visit (HOSPITAL_COMMUNITY): Payer: No Typology Code available for payment source | Admitting: Physician Assistant

## 2019-09-24 ENCOUNTER — Other Ambulatory Visit: Payer: Self-pay

## 2019-09-24 ENCOUNTER — Telehealth: Payer: Self-pay

## 2019-09-24 ENCOUNTER — Encounter (HOSPITAL_COMMUNITY): Payer: Self-pay | Admitting: Orthopaedic Surgery

## 2019-09-24 ENCOUNTER — Ambulatory Visit (INDEPENDENT_AMBULATORY_CARE_PROVIDER_SITE_OTHER)
Admission: RE | Admit: 2019-09-24 | Discharge: 2019-09-24 | Disposition: A | Discharge status: Home / Self Care | Payer: No Typology Code available for payment source | Source: Ambulatory Visit | Attending: Surgery | Admitting: Surgery

## 2019-09-24 DIAGNOSIS — M79662 Pain in left lower leg: Secondary | ICD-10-CM

## 2019-09-24 DIAGNOSIS — S86012A Strain of left Achilles tendon, initial encounter: Principal | ICD-10-CM | POA: Diagnosis present

## 2019-09-24 DIAGNOSIS — F17210 Nicotine dependence, cigarettes, uncomplicated: Secondary | ICD-10-CM | POA: Diagnosis not present

## 2019-09-24 DIAGNOSIS — Y9367 Activity, basketball: Secondary | ICD-10-CM | POA: Insufficient documentation

## 2019-09-24 HISTORY — DX: Bipolar disorder, unspecified: F31.9

## 2019-09-24 HISTORY — PX: ACHILLES TENDON SURGERY: SHX542

## 2019-09-24 HISTORY — DX: Schizophrenia, unspecified: F20.9

## 2019-09-24 LAB — COMPREHENSIVE METABOLIC PANEL
ALT: 21 U/L (ref 0–44)
AST: 21 U/L (ref 15–41)
Albumin: 4.3 g/dL (ref 3.5–5.0)
Alkaline Phosphatase: 71 U/L (ref 38–126)
Anion gap: 12 (ref 5–15)
BUN: 10 mg/dL (ref 6–20)
CO2: 23 mmol/L (ref 22–32)
Calcium: 9.3 mg/dL (ref 8.9–10.3)
Chloride: 106 mmol/L (ref 98–111)
Creatinine, Ser: 0.85 mg/dL (ref 0.61–1.24)
GFR calc Af Amer: >60 mL/min (ref >60)
GFR calc non Af Amer: >60 mL/min (ref >60)
Glucose, Bld: 95 mg/dL (ref 70–99)
Potassium: 3.9 mmol/L (ref 3.5–5.1)
Sodium: 141 mmol/L (ref 135–145)
Total Bilirubin: 0.6 mg/dL (ref 0.3–1.2)
Total Protein: 7.5 g/dL (ref 6.5–8.1)

## 2019-09-24 LAB — CBC
HCT: 44.5 % (ref 39.0–52.0)
Hemoglobin: 14.6 g/dL (ref 13.0–17.0)
MCH: 28.6 pg (ref 26.0–34.0)
MCHC: 32.8 g/dL (ref 30.0–36.0)
MCV: 87.1 fL (ref 80.0–100.0)
Platelets: 216 10*3/uL (ref 150–400)
RBC: 5.11 MIL/uL (ref 4.22–5.81)
RDW: 13.3 % (ref 11.5–15.5)
WBC: 6 10*3/uL (ref 4.0–10.5)
nRBC: 0 % (ref 0.0–0.2)

## 2019-09-24 LAB — SURGICAL PCR SCREEN
MRSA, PCR: NEGATIVE
Staphylococcus aureus: NEGATIVE

## 2019-09-24 SURGERY — REPAIR, TENDON, ACHILLES
Anesthesia: General | Laterality: Left

## 2019-09-24 MED ORDER — HYDROMORPHONE HCL 1 MG/ML IJ SOLN
0.2500 mg | INTRAMUSCULAR | Status: DC | PRN
  Administered 2019-09-24 (×2): 0.5 mg via INTRAVENOUS

## 2019-09-24 MED ORDER — NICOTINE 14 MG/24HR TD PT24: 14.0000 mg | MEDICATED_PATCH | Freq: Every day | TRANSDERMAL | Status: DC

## 2019-09-24 MED ORDER — METHOCARBAMOL 500 MG PO TABS
500.0000 mg | ORAL_TABLET | Freq: Four times a day (QID) | ORAL | Status: DC | PRN
  Administered 2019-09-24: 500 mg via ORAL
  Filled 2019-09-24: qty 1

## 2019-09-24 MED ORDER — MIDAZOLAM HCL 2 MG/2ML IJ SOLN
INTRAMUSCULAR | Status: AC
  Administered 2019-09-24: 2 mg via INTRAVENOUS
  Filled 2019-09-24: qty 2

## 2019-09-24 MED ORDER — METOCLOPRAMIDE HCL 5 MG PO TABS: 5.0000 mg | ORAL_TABLET | Freq: Three times a day (TID) | ORAL | Status: DC | PRN

## 2019-09-24 MED ORDER — OXYCODONE HCL 5 MG PO TABS
5.0000 mg | ORAL_TABLET | ORAL | Status: DC | PRN
  Administered 2019-09-24: 5 mg via ORAL
  Filled 2019-09-24: qty 1

## 2019-09-24 MED ORDER — METHOCARBAMOL 1000 MG/10ML IJ SOLN
500.0000 mg | Freq: Four times a day (QID) | INTRAVENOUS | Status: DC | PRN
  Filled 2019-09-24: qty 5

## 2019-09-24 MED ORDER — MEPERIDINE HCL 25 MG/ML IJ SOLN: 6.2500 mg | INTRAMUSCULAR | Status: DC | PRN

## 2019-09-24 MED ORDER — PROPOFOL 10 MG/ML IV BOLUS
INTRAVENOUS | Status: DC | PRN
  Administered 2019-09-24: 250 mg via INTRAVENOUS

## 2019-09-24 MED ORDER — CEFAZOLIN SODIUM-DEXTROSE 1-4 GM/50ML-% IV SOLN
1.0000 g | Freq: Three times a day (TID) | INTRAVENOUS | Status: DC
  Administered 2019-09-24 – 2019-09-25 (×2): 1 g via INTRAVENOUS
  Filled 2019-09-24 (×2): qty 50

## 2019-09-24 MED ORDER — ONDANSETRON HCL 4 MG/2ML IJ SOLN
INTRAMUSCULAR | Status: AC
  Filled 2019-09-24: qty 2

## 2019-09-24 MED ORDER — CEFAZOLIN SODIUM-DEXTROSE 2-4 GM/100ML-% IV SOLN
2.0000 g | INTRAVENOUS | Status: AC
  Administered 2019-09-24: 2 g via INTRAVENOUS

## 2019-09-24 MED ORDER — ONDANSETRON HCL 4 MG/2ML IJ SOLN
INTRAMUSCULAR | Status: DC | PRN
  Administered 2019-09-24: 4 mg via INTRAVENOUS

## 2019-09-24 MED ORDER — DEXAMETHASONE SODIUM PHOSPHATE 10 MG/ML IJ SOLN
INTRAMUSCULAR | Status: AC
  Filled 2019-09-24: qty 1

## 2019-09-24 MED ORDER — HYDROMORPHONE HCL 1 MG/ML IJ SOLN
0.5000 mg | INTRAMUSCULAR | Status: DC | PRN
  Administered 2019-09-24 – 2019-09-25 (×3): 0.5 mg via INTRAVENOUS
  Filled 2019-09-24 (×3): qty 1

## 2019-09-24 MED ORDER — FENTANYL CITRATE (PF) 250 MCG/5ML IJ SOLN
INTRAMUSCULAR | Status: AC
  Filled 2019-09-24: qty 5

## 2019-09-24 MED ORDER — MIDAZOLAM HCL 2 MG/2ML IJ SOLN
INTRAMUSCULAR | Status: AC
  Filled 2019-09-24: qty 2

## 2019-09-24 MED ORDER — BUPIVACAINE-EPINEPHRINE (PF) 0.5% -1:200000 IJ SOLN
INTRAMUSCULAR | Status: DC | PRN
  Administered 2019-09-24: 30 mL via PERINEURAL

## 2019-09-24 MED ORDER — LIDOCAINE 2% (20 MG/ML) 5 ML SYRINGE
INTRAMUSCULAR | Status: AC
  Filled 2019-09-24: qty 5

## 2019-09-24 MED ORDER — PROMETHAZINE HCL 25 MG/ML IJ SOLN: 6.2500 mg | INTRAMUSCULAR | Status: DC | PRN

## 2019-09-24 MED ORDER — ONDANSETRON HCL 4 MG/2ML IJ SOLN: 4.0000 mg | Freq: Four times a day (QID) | INTRAMUSCULAR | Status: DC | PRN

## 2019-09-24 MED ORDER — SODIUM CHLORIDE 0.9 % IV SOLN: INTRAVENOUS | Status: DC

## 2019-09-24 MED ORDER — MIDAZOLAM HCL 2 MG/2ML IJ SOLN: 2.0000 mg | Freq: Once | INTRAMUSCULAR | Status: AC

## 2019-09-24 MED ORDER — PROPOFOL 10 MG/ML IV BOLUS
INTRAVENOUS | Status: AC
  Filled 2019-09-24: qty 20

## 2019-09-24 MED ORDER — CEFAZOLIN SODIUM-DEXTROSE 2-4 GM/100ML-% IV SOLN
INTRAVENOUS | Status: AC
  Filled 2019-09-24: qty 100

## 2019-09-24 MED ORDER — 0.9 % SODIUM CHLORIDE (POUR BTL) OPTIME
TOPICAL | Status: DC | PRN
  Administered 2019-09-24: 1000 mL

## 2019-09-24 MED ORDER — FENTANYL CITRATE (PF) 100 MCG/2ML IJ SOLN
INTRAMUSCULAR | Status: AC
  Administered 2019-09-24: 100 ug via INTRAVENOUS
  Filled 2019-09-24: qty 2

## 2019-09-24 MED ORDER — ROPIVACAINE HCL 7.5 MG/ML IJ SOLN
INTRAMUSCULAR | Status: DC | PRN
  Administered 2019-09-24: 20 mL via PERINEURAL

## 2019-09-24 MED ORDER — LACTATED RINGERS IV SOLN: INTRAVENOUS | Status: DC

## 2019-09-24 MED ORDER — MIDAZOLAM HCL 2 MG/2ML IJ SOLN
0.5000 mg | Freq: Once | INTRAMUSCULAR | Status: AC | PRN
  Administered 2019-09-24: 1 mg via INTRAVENOUS

## 2019-09-24 MED ORDER — HYDROMORPHONE HCL 1 MG/ML IJ SOLN
INTRAMUSCULAR | Status: AC
  Filled 2019-09-24: qty 1

## 2019-09-24 MED ORDER — DEXAMETHASONE SODIUM PHOSPHATE 10 MG/ML IJ SOLN
INTRAMUSCULAR | Status: DC | PRN
  Administered 2019-09-24: 10 mg via INTRAVENOUS

## 2019-09-24 MED ORDER — FENTANYL CITRATE (PF) 100 MCG/2ML IJ SOLN: 100.0000 ug | Freq: Once | INTRAMUSCULAR | Status: AC

## 2019-09-24 MED ORDER — DOCUSATE SODIUM 100 MG PO CAPS
100.0000 mg | ORAL_CAPSULE | Freq: Two times a day (BID) | ORAL | Status: DC
  Administered 2019-09-24: 100 mg via ORAL
  Filled 2019-09-24: qty 1

## 2019-09-24 MED ORDER — ACETAMINOPHEN 325 MG PO TABS: 325.0000 mg | ORAL_TABLET | Freq: Four times a day (QID) | ORAL | Status: DC | PRN

## 2019-09-24 MED ORDER — LIDOCAINE HCL (CARDIAC) PF 100 MG/5ML IV SOSY
PREFILLED_SYRINGE | INTRAVENOUS | Status: DC | PRN
  Administered 2019-09-24: 20 mg via INTRAVENOUS

## 2019-09-24 MED ORDER — METOCLOPRAMIDE HCL 5 MG/ML IJ SOLN: 5.0000 mg | Freq: Three times a day (TID) | INTRAMUSCULAR | Status: DC | PRN

## 2019-09-24 MED ORDER — NICOTINE 14 MG/24HR TD PT24
14.0000 mg | MEDICATED_PATCH | Freq: Every day | TRANSDERMAL | Status: DC
  Administered 2019-09-24: 14 mg via TRANSDERMAL
  Filled 2019-09-24: qty 1

## 2019-09-24 MED ORDER — ONDANSETRON HCL 4 MG PO TABS: 4.0000 mg | ORAL_TABLET | Freq: Four times a day (QID) | ORAL | Status: DC | PRN

## 2019-09-24 MED ORDER — FENTANYL CITRATE (PF) 100 MCG/2ML IJ SOLN
INTRAMUSCULAR | Status: DC | PRN
  Administered 2019-09-24 (×5): 50 ug via INTRAVENOUS

## 2019-09-24 SURGICAL SUPPLY — 47 items
BLADE SURG 10 STRL SS (BLADE) ×3 IMPLANT
BNDG ESMARK 4X9 LF (GAUZE/BANDAGES/DRESSINGS) ×2 IMPLANT
BNDG GAUZE ELAST 4 BULKY (GAUZE/BANDAGES/DRESSINGS) ×2 IMPLANT
CLOSURE WOUND 1/2 X4 (GAUZE/BANDAGES/DRESSINGS)
COVER MAYO STAND STRL (DRAPES) ×3 IMPLANT
COVER WAND RF STERILE (DRAPES) ×1 IMPLANT
CUFF TOURN SGL QUICK 34 (TOURNIQUET CUFF)
CUFF TOURN SGL QUICK 42 (TOURNIQUET CUFF) IMPLANT
CUFF TRNQT CYL 34X4.125X (TOURNIQUET CUFF) IMPLANT
DRAPE INCISE IOBAN 66X45 STRL (DRAPES) ×3 IMPLANT
DRAPE U-SHAPE 47X51 STRL (DRAPES) ×3 IMPLANT
DRSG PAD ABDOMINAL 8X10 ST (GAUZE/BANDAGES/DRESSINGS) ×2 IMPLANT
DURAPREP 26ML APPLICATOR (WOUND CARE) ×3 IMPLANT
ELECT REM PT RETURN 9FT ADLT (ELECTROSURGICAL) ×3
ELECTRODE REM PT RTRN 9FT ADLT (ELECTROSURGICAL) ×1 IMPLANT
GAUZE SPONGE 4X4 12PLY STRL (GAUZE/BANDAGES/DRESSINGS) ×2 IMPLANT
GAUZE XEROFORM 1X8 LF (GAUZE/BANDAGES/DRESSINGS) ×2 IMPLANT
GLOVE BIOGEL PI IND STRL 8 (GLOVE) ×2 IMPLANT
GLOVE BIOGEL PI INDICATOR 8 (GLOVE) ×4
GLOVE ORTHO TXT STRL SZ7.5 (GLOVE) ×6 IMPLANT
GOWN STRL REUS W/ TWL LRG LVL3 (GOWN DISPOSABLE) ×1 IMPLANT
GOWN STRL REUS W/ TWL XL LVL3 (GOWN DISPOSABLE) ×1 IMPLANT
GOWN STRL REUS W/TWL 2XL LVL3 (GOWN DISPOSABLE) ×3 IMPLANT
GOWN STRL REUS W/TWL LRG LVL3 (GOWN DISPOSABLE) ×2
GOWN STRL REUS W/TWL XL LVL3 (GOWN DISPOSABLE) ×2
KIT TURNOVER KIT B (KITS) ×3 IMPLANT
MANIFOLD NEPTUNE II (INSTRUMENTS) ×3 IMPLANT
NDL SUT 6 .5 CRC .975X.05 MAYO (NEEDLE) IMPLANT
NEEDLE MAYO TAPER (NEEDLE)
NS IRRIG 1000ML POUR BTL (IV SOLUTION) ×3 IMPLANT
PACK ORTHO EXTREMITY (CUSTOM PROCEDURE TRAY) ×3 IMPLANT
PAD ARMBOARD 7.5X6 YLW CONV (MISCELLANEOUS) ×6 IMPLANT
PAD CAST 4YDX4 CTTN HI CHSV (CAST SUPPLIES) IMPLANT
PADDING CAST COTTON 4X4 STRL (CAST SUPPLIES) ×2
PADDING CAST COTTON 6X4 STRL (CAST SUPPLIES) ×2 IMPLANT
STRIP CLOSURE SKIN 1/2X4 (GAUZE/BANDAGES/DRESSINGS) ×1 IMPLANT
SUCTION FRAZIER HANDLE 10FR (MISCELLANEOUS) ×2
SUCTION TUBE FRAZIER 10FR DISP (MISCELLANEOUS) ×1 IMPLANT
SUT VIC AB 2-0 CT1 27 (SUTURE)
SUT VIC AB 2-0 CT1 TAPERPNT 27 (SUTURE) ×1 IMPLANT
SUT VIC AB 3-0 FS2 27 (SUTURE) IMPLANT
SUT VICRYL 0 TIES 12 18 (SUTURE) IMPLANT
TOWEL GREEN STERILE (TOWEL DISPOSABLE) ×3 IMPLANT
TOWEL GREEN STERILE FF (TOWEL DISPOSABLE) ×3 IMPLANT
TUBE CONNECTING 12'X1/4 (SUCTIONS) ×1
TUBE CONNECTING 12X1/4 (SUCTIONS) ×2 IMPLANT
WATER STERILE IRR 1000ML POUR (IV SOLUTION) ×3 IMPLANT

## 2019-09-24 NOTE — H&P (Signed)
Progress Notes by Eunice Blase, MD at 09/19/2019 3:00 PM Author: Eunice Blase, MD Author Type: Physician Filed: 09/19/2019 5:04 PM  Note Status: Signed Cosign: Cosign Not Required Encounter Date: 09/19/2019  Editor: Eunice Blase, MD (Physician)    Limited diagnostic ultrasound left Achilles: The attachment on the calcaneus is intact.  Moving proximally, there is swelling of the tendon with hypoechoic changes.  About 7 to 8 cm proximal to the calcaneus there is a high-grade tear.  The ends of the tendon are about 1 cm apart.  I do not think there is continuity with dynamic imaging.    Progress Notes by Lanae Crumbly, PA-C at 09/19/2019 3:00 PM Author: Lanae Crumbly, PA-C Author Type: Physician Assistant Certified Filed: 6/73/4193 5:04 PM  Note Status: Signed Cosign: Cosign Not Required Encounter Date: 09/19/2019  Editor: Herbie Saxon (Physician Assistant Certified)      Office Visit Note              Patient: Evan Wiley                                   Date of Birth: [birth year]                                                    MRN: [id number] Visit Date: 09/19/2019                                                                     Requested by: No referring provider defined for this encounter. PCP: Patient, No Pcp Per   Assessment & Plan: Visit Diagnoses:  1. Pain in left ankle and joints of left foot   2. Pain of left calf   3. Rupture of left Achilles tendon, initial encounter     Plan: Patient advised that with his injury he will be needing surgical intervention with left Achilles tendon repair.  Advised patient that this is definitely an injury that should be addressed sooner than later with surgical intervention.  We will save up with him on the schedule for next Wednesday.  Patient was sent upstairs to see Dr. Junius Roads for diagnostic ultrasound.  Patient is also been having increased left calf pain.  Recommend that he get a stat venous Doppler to rule  out DVT.  Advised patient that vascular labs are close with the evening and there is recommended that he go to the emergency room to have this done.  If he does not go to the emergency room Dr. Lorin Mercy said it is okay to schedule him  an appointment for venous Doppler Monday.  Patient understands the risk of having a DVT that can lead to a pulmonary embolism and death.  Surgical procedure discussed along with potential recovery time.  Dr. Lorin Mercy also discussed the need for surgery with patient today.  All questions answered.  He was given a cam boot as well.  Follow-Up Instructions: No follow-ups on file.   Orders:     Orders Placed This Encounter  Procedures  .  XR Ankle Complete Left  . VAS [place] LOWER EXTREMITY VENOUS (DVT)   No orders of the defined types were placed in this encounter.     Procedures: No procedures performed   Clinical Data: No additional findings.   Subjective:    Chief Complaint  Patient presents with  . Left Ankle - Pain    HPI 39 year old black male comes in today with complaints of left ankle pain. August 27, 2019 was playing basketball when he planted and took off to run he felt a "pop" in the back of his lower leg.  He was unable to ambulate immediately after this.  He did go to the emergency room that evening where he was diagnosed with a possible Achilles tendon rupture.  Patient was put in a splint and advised to follow-up in our office but he had canceled earlier appointment that was scheduled and has since taken the splint off and and ambulating with crutches. He continues complain of pain at the Achilles tendon and he is also having pain in the calf as well.  No complaints of chest pain,  shortness of breath. Review of Systems No current cardiac pulmonary GI GU issues  Objective: Vital Signs: BP 117/87 (BP Location: Left Arm, Patient Position: Sitting, Cuff Size: Normal)   Pulse 80   Ht 5\' 11"  (1.803 m)   Wt 220 lb (99.8 kg)   BMI 30.68  kg/m   Physical Exam Constitutional:      General: He is not in acute distress. HENT:     Head: Normocephalic and atraumatic.  Eyes:     Extraocular Movements: Extraocular movements intact.     Pupils: Pupils are equal, round, and reactive to light.  Pulmonary:     Effort: No respiratory distress.  Musculoskeletal:     Comments: Patient has moderate to marked left calf tenderness.  Patient is has marked tenderness over the Achilles tendon where there is a palpable defect around the watershed.  Positive Thompson test.  Neurovascular intact.  Neurological:     General: No focal deficit present.     Mental Status: He is alert and oriented to person, place, and time.     Ortho Exam  Specialty Comments:  No specialty comments available.  Imaging: No results found.   PMFS History: There are no problems to display for this patient.  Past Medical History:  Diagnosis Date  . Asthma   . Heart murmur   . Reactive airway disease          Family History  Problem Relation Age of Onset  . Hypertension Mother   . Diabetes Mother   . Diabetes Father   . Hypertension Father          Past Surgical History:  Procedure Laterality Date  . FACIAL COSMETIC SURGERY    . FRACTURE SURGERY    . WISDOM TOOTH EXTRACTION     Social History        Occupational History  . Not on file  Tobacco Use  . Smoking status: Current Every Day Smoker    Packs/day: 1.00    Types: Cigarettes  . Smokeless tobacco: Never Used  Substance and Sexual Activity  . Alcohol use: Yes  . Drug use: Not Currently    Comment: marijuana daily  . Sexual activity: Yes    Birth control/protection: None

## 2019-09-24 NOTE — Telephone Encounter (Signed)
Mathew called with Pulmonary report for patient.  Patient is Negative for DVT, left leg.  Zonia Kief, PA is aware.

## 2019-09-24 NOTE — Op Note (Signed)
Preop diagnosis: Left Achilles rupture  Postop diagnosis: Same  Procedure: Repair left Achilles tendon rupture.  Surgeon Annell Greening, MD  Assistant: Zonia Kief, PA-C medically necessary and present for the entire procedure  Anesthesia : General plus preoperative block  Tourniquet: 300 x 21 minutes.  Procedure: After induction of anesthesia proximal thigh tourniquet prepping with DuraPrep timeout procedure usual extremity sheets and drapes were applied sterile skin marker was used for incision along the medial aspect of the Achilles tendon.  Patient had a rupture of ultrasound was 7 to 8 cm proximal to the insertion site in the calcaneus.  Leg was elevated tourniquet inflated.  Incision was made sheath was opened and tear was shredded over a short distance segment.  There is extensive scarring distally but no other tearing areas.  #2 FiberWire were used for multiple sutures repairing the tendon some medial some lateral.  Running stitch was run over the top smoothing the repair.  Sheath was reapproximated 2-0 Vicryl subtenons tissue skin staple closure postop dressing and short leg splint was applied patient tolerated procedure well.

## 2019-09-24 NOTE — Anesthesia Postprocedure Evaluation (Signed)
Anesthesia Post Note  Patient: Evan Wiley  Procedure(s) Performed: LEFT ACHILLES TENDON REPAIR (Left )     Patient location during evaluation: PACU Anesthesia Type: General Level of consciousness: oriented, sedated and patient cooperative Pain management: pain level controlled (able to sleep) Vital Signs Assessment: post-procedure vital signs reviewed and stable Respiratory status: spontaneous breathing, nonlabored ventilation and respiratory function stable Cardiovascular status: blood pressure returned to baseline and stable Postop Assessment: no apparent nausea or vomiting Anesthetic complications: no    Last Vitals:  Vitals:   09/24/19 1738 09/24/19 1754  BP: (!) 124/97 113/87  Pulse: 82 78  Resp: 15 11  Temp:    SpO2: 99% 99%    Last Pain:  Vitals:   09/24/19 1754  TempSrc:   PainSc: 7                  Siler Mavis,E. Heavyn Yearsley

## 2019-09-24 NOTE — Progress Notes (Signed)
Received pt from PACU accompanied by staff,alert/oriented in no acute dsitress. Pt orientated to room/equipments . Menu guide provided. Welcome pack/guide to follow. Pt/ family have been informed that facility is not responsible for any losses/damages of personal/valuable belongings.pt/family verbalized understanding.3 side rails up and bed in lowest position. All wheels locked, call bell/room phone within reach. No complaints.

## 2019-09-24 NOTE — Anesthesia Procedure Notes (Signed)
Anesthesia Regional Block: Adductor canal block   Pre-Anesthetic Checklist: ,, timeout performed, Correct Patient, Correct Site, Correct Laterality, Correct Procedure, Correct Position, site marked, Risks and benefits discussed,  Surgical consent,  Pre-op evaluation,  At surgeon's request and post-op pain management  Laterality: Left and Lower  Prep: chloraprep       Needles:  Injection technique: Single-shot  Needle Type: Echogenic Needle     Needle Length: 9cm  Needle Gauge: 21     Additional Needles:   Procedures:,,,, ultrasound used (permanent image in chart),,,,  Narrative:  Start time: 09/24/2019 3:30 PM End time: 09/24/2019 3:36 PM Injection made incrementally with aspirations every 5 mL.  Performed by: Personally  Anesthesiologist: Jairo Ben, MD  Additional Notes: Pt identified in Holding room.  Monitors applied. Working IV access confirmed. Sterile prep L thigh.  #21ga ECHOgenic needle into adductor canal with US guidance.  20cc 0.75% Ropivacaine injected incrementally after negative test dose.  Patient asymptomatic, VSS, no heme aspirated, tolerated well.  Sandford Craze, MD

## 2019-09-24 NOTE — Interval H&P Note (Signed)
History and Physical Interval Note:  09/24/2019 3:54 PM  Evan Wiley  has presented today for surgery, with the diagnosis of left achilles tendon rupture.  The various methods of treatment have been discussed with the patient and family. After consideration of risks, benefits and other options for treatment, the patient has consented to  Procedure(s): LEFT ACHILLES TENDON REPAIR (Left) as a surgical intervention.  The patient's history has been reviewed, patient examined, no change in status, stable for surgery.  I have reviewed the patient's chart and labs.  Questions were answered to the patient's satisfaction.     Eldred Manges

## 2019-09-24 NOTE — Transfer of Care (Signed)
Immediate Anesthesia Transfer of Care Note  Patient: Evan Wiley  Procedure(s) Performed: LEFT ACHILLES TENDON REPAIR (Left )  Patient Location: PACU  Anesthesia Type:General  Level of Consciousness: awake, oriented and patient cooperative  Airway & Oxygen Therapy: Patient Spontanous Breathing and Patient connected to nasal cannula oxygen  Post-op Assessment: Report given to RN and Post -op Vital signs reviewed and stable  Post vital signs: Reviewed  Last Vitals:  Vitals Value Taken Time  BP 94/78 09/24/19 1722  Temp    Pulse 88 09/24/19 1724  Resp 21 09/24/19 1724  SpO2 94 % 09/24/19 1724  Vitals shown include unvalidated device data.  Last Pain:  Vitals:   09/24/19 1513  TempSrc:   PainSc: 5       Patients Stated Pain Goal: 5 (09/24/19 1513)  Complications: No apparent anesthesia complications

## 2019-09-24 NOTE — Anesthesia Procedure Notes (Signed)
Anesthesia Regional Block: Popliteal block   Pre-Anesthetic Checklist: ,, timeout performed, Correct Patient, Correct Site, Correct Laterality, Correct Procedure, Correct Position, site marked, Risks and benefits discussed,  Surgical consent,  Pre-op evaluation,  At surgeon's request and post-op pain management  Laterality: Left and Lower  Prep: chloraprep       Needles:  Injection technique: Single-shot  Needle Type: Echogenic Needle     Needle Length: 9cm  Needle Gauge: 21     Additional Needles:   Procedures:,,,, ultrasound used (permanent image in chart),,,,  Narrative:  Start time: 09/24/2019 3:38 PM End time: 09/24/2019 3:41 PM Injection made incrementally with aspirations every 5 mL.  Performed by: Personally  Anesthesiologist: Jairo Ben, MD  Additional Notes: Pt identified in Holding room.  Monitors applied. Working IV access confirmed. Sterile prep L lateral knee.  #21ga ECHOgenic needle to common peroneal nerve with US guidance.  30cc 0.5% Bupivacaine with 1:200k epi injected incrementally after negative test dose.  Patient asymptomatic, VSS, no heme aspirated, tolerated well.  Sandford Craze, MD

## 2019-09-24 NOTE — Anesthesia Procedure Notes (Signed)
Procedure Name: LMA Insertion Date/Time: 09/24/2019 4:19 PM Performed by: Lovie Chol, CRNA Pre-anesthesia Checklist: Patient identified, Emergency Drugs available, Suction available and Patient being monitored Patient Re-evaluated:Patient Re-evaluated prior to induction Oxygen Delivery Method: Circle System Utilized Preoxygenation: Pre-oxygenation with 100% oxygen Induction Type: IV induction Ventilation: Mask ventilation without difficulty LMA: LMA inserted LMA Size: 4.0 Number of attempts: 1 Airway Equipment and Method: Bite block Placement Confirmation: positive ETCO2 and breath sounds checked- equal and bilateral Tube secured with: Tape Dental Injury: Teeth and Oropharynx as per pre-operative assessment

## 2019-09-25 DIAGNOSIS — S86012A Strain of left Achilles tendon, initial encounter: Secondary | ICD-10-CM | POA: Diagnosis not present

## 2019-09-25 MED ORDER — OXYCODONE-ACETAMINOPHEN 5-325 MG PO TABS: 1.0000 | ORAL_TABLET | ORAL | 0 refills | Status: AC | PRN

## 2019-09-25 NOTE — Progress Notes (Signed)
Discharge summary provided to pt. Explained and educated pt, verbalizes understanding. No complaints voiced. Alert/oriented in no apparent distress. Discharge to home as ordered. Brother is resposible for the pt's transportation.

## 2019-09-25 NOTE — Progress Notes (Signed)
Subjective: 1 Day Post-Op Procedure(s) (LRB): LEFT ACHILLES TENDON REPAIR (Left) Patient reports pain as mild.    Objective: Vital signs in last 24 hours: Temp:  [97.4 F (36.3 C)-98.2 F (36.8 C)] 98.2 F (36.8 C) (05/20 0738) Pulse Rate:  [70-89] 70 (05/20 0738) Resp:  [11-20] 16 (05/20 0738) BP: (94-137)/(76-97) 118/81 (05/20 0738) SpO2:  [94 %-100 %] 99 % (05/20 0738) Weight:  [99.8 kg] 99.8 kg (05/19 1454)  Intake/Output from previous day: 05/19 0701 - 05/20 0700 In: 1416.4 [I.V.:1066.4; IV Piggyback:150] Out: 453 [Urine:450; Blood:3] Intake/Output this shift: No intake/output data recorded.  Recent Labs    09/24/19 1600  HGB 14.6   Recent Labs    09/24/19 1600  WBC 6.0  RBC 5.11  HCT 44.5  PLT 216   Recent Labs    09/24/19 1600  NA 141  K 3.9  CL 106  CO2 23  BUN 10  CREATININE 0.85  GLUCOSE 95  CALCIUM 9.3   No results for input(s): LABPT, INR in the last 72 hours.  splint intact.  VAS [place] LOWER EXTREMITY VENOUS (DVT)  Result Date: 09/24/2019  Lower Venous DVT Study Indications: Pain, Swelling, and Edema.  Risk Factors: Surgery Evaluation prior to Achilles tendon repair left leg. Performing Technologist: Delorise Shiner RVT  Examination Guidelines: A complete evaluation includes B-mode imaging, spectral Doppler, color Doppler, and power Doppler as needed of all accessible portions of each vessel. Bilateral testing is considered an integral part of a complete examination. Limited examinations for reoccurring indications may be performed as noted. The reflux portion of the exam is performed with the patient in reverse Trendelenburg.  +-----+---------------+---------+-----------+----------+--------------+ RIGHTCompressibilityPhasicitySpontaneityPropertiesThrombus Aging +-----+---------------+---------+-----------+----------+--------------+ CFV  Full           Yes      Yes                                  +-----+---------------+---------+-----------+----------+--------------+   +---------+---------------+---------+-----------+----------+--------------+ LEFT     CompressibilityPhasicitySpontaneityPropertiesThrombus Aging +---------+---------------+---------+-----------+----------+--------------+ CFV      Full           Yes      Yes                                 +---------+---------------+---------+-----------+----------+--------------+ SFJ      Full           Yes      Yes                                 +---------+---------------+---------+-----------+----------+--------------+ FV Prox  Full           Yes      Yes                                 +---------+---------------+---------+-----------+----------+--------------+ FV Mid   Full           Yes      Yes                                 +---------+---------------+---------+-----------+----------+--------------+ FV DistalFull           Yes      Yes                                 +---------+---------------+---------+-----------+----------+--------------+  PFV      Full                    Yes                                 +---------+---------------+---------+-----------+----------+--------------+ POP      Full           Yes      Yes                                 +---------+---------------+---------+-----------+----------+--------------+ PTV      Full                    Yes                                 +---------+---------------+---------+-----------+----------+--------------+ PERO     Full                    Yes                                 +---------+---------------+---------+-----------+----------+--------------+ GSV      Full                    Yes                                 +---------+---------------+---------+-----------+----------+--------------+ SSV      Full                    Yes                                  +---------+---------------+---------+-----------+----------+--------------+     Summary: RIGHT: - No evidence of deep vein thrombosis in the lower extremity. No indirect evidence of obstruction proximal to the inguinal ligament.  LEFT: - There is no evidence of deep vein thrombosis in the lower extremity. - There is no evidence of superficial venous thrombosis.  - No cystic structure found in the popliteal fossa.  *See table(s) above for measurements and observations. Electronically signed by Waverly Ferrari MD on 09/24/2019 at 1:32:18 PM.    Final     Assessment/Plan: 1 Day Post-Op Procedure(s) (LRB): LEFT ACHILLES TENDON REPAIR (Left) Plan:  Doppler neg for DVT, discharge home office 2 wks. Discussed in detail multiple times the importance of keeping foot elevated above heart and not walking on foot  Evan Wiley 09/25/2019, 8:46 AM

## 2019-09-25 NOTE — Plan of Care (Addendum)
Pt has good sensation in toes and able to wiggle. Pt educated on keeps foot elevated. Pt pain uncontrol and will continue to monitor.   Problem: Education: Goal: Knowledge of General Education information will improve Description: Including pain rating scale, medication(s)/side effects and non-pharmacologic comfort measures Outcome: Progressing   Problem: Activity: Goal: Risk for activity intolerance will decrease Outcome: Progressing   Problem: Pain Managment: Goal: General experience of comfort will improve Outcome: Progressing   Problem: Safety: Goal: Ability to remain free from injury will improve Outcome: Progressing   Problem: Skin Integrity: Goal: Risk for impaired skin integrity will decrease Outcome: Progressing

## 2019-09-25 NOTE — Discharge Instructions (Signed)
Keep your splint dry.  Keep her foot elevated above your heart to decrease swelling and decrease wrist that wound will get infected.  You can be up with your crutches to go to the bathroom or when you get up to eat.  Do not walk on your foot.  See Dr. Kevan Ny in 2 weeks.  Pain medication has been sent into your pharmacy.

## 2019-09-25 NOTE — Plan of Care (Signed)
  Problem: Education: Goal: Knowledge of General Education information will improve Description: Including pain rating scale, medication(s)/side effects and non-pharmacologic comfort measures Outcome: Progressing   Problem: Health Behavior/Discharge Planning: Goal: Ability to manage health-related needs will improve Outcome: Progressing   Problem: Activity: Goal: Risk for activity intolerance will decrease Outcome: Progressing   Problem: Coping: Goal: Level of anxiety will decrease Outcome: Progressing   Problem: Elimination: Goal: Will not experience complications related to bowel motility Outcome: Progressing   Problem: Pain Managment: Goal: General experience of comfort will improve Outcome: Progressing   Problem: Safety: Goal: Ability to remain free from injury will improve Outcome: Progressing   

## 2019-09-25 NOTE — Evaluation (Signed)
Physical Therapy Evaluation Patient Details Name: Evan Wiley MRN: [id number] DOB: 01/18/1981 Today's Date: 09/25/2019   History of Present Illness  Pt is a 39 y.o. M with no significant PMH on file who presents with left achilles rupture s/p repair 09/24/2019.   Clinical Impression  Patient evaluated by Physical Therapy with no further acute PT needs identified. Pt able to hop 150 feet with crutches without physical assist. Demonstrates good adherence to weightbearing precautions. Education provided regarding cryotherapy, elevation, weightbearing precautions, stair negotiation, LLE open chain exercises. All education has been completed and the patient has no further questions. No follow-up Physical Therapy or equipment needs. PT is signing off. Thank you for this referral.     Follow Up Recommendations No PT follow up    Equipment Recommendations  None recommended by PT (has crutches)   Recommendations for Other Services       Precautions / Restrictions Precautions Precautions: None Restrictions Weight Bearing Restrictions: Yes LLE Weight Bearing: Non weight bearing      Mobility  Bed Mobility Overal bed mobility: Independent                Transfers Overall transfer level: Modified independent Equipment used: Crutches                Ambulation/Gait Ambulation/Gait assistance: Modified independent (Device/Increase time) Gait Distance (Feet): 150 Feet Assistive device: Rolling walker (2 wheeled) Gait Pattern/deviations: Step-through pattern     General Gait Details: Pt able to correctly demonstrate a hop through pattern with good adherence to weightbearing precautions. No overt LOB  Stairs            Wheelchair Mobility    Modified Rankin (Stroke Patients Only)       Balance Overall balance assessment: No apparent balance deficits (not formally assessed)                                           Pertinent  Vitals/Pain Pain Assessment: Faces Faces Pain Scale: Hurts even more Pain Location: surgical site Pain Descriptors / Indicators: Grimacing;Guarding Pain Intervention(s): Monitored during session;Repositioned;Ice applied    Home Living Family/patient expects to be discharged to:: Private residence Living Arrangements: Parent Available Help at Discharge: Family Type of Home: Apartment Home Access: Stairs to enter Entrance Stairs-Rails: Right Entrance Stairs-Number of Steps: (flight) Home Layout: One level Home Equipment: Crutches      Prior Function Level of Independence: Independent with assistive device(s)         Comments: Has been using crutches past 3 weeks     Hand Dominance        Extremity/Trunk Assessment   Upper Extremity Assessment Upper Extremity Assessment: Overall WFL for tasks assessed    Lower Extremity Assessment Lower Extremity Assessment: LLE deficits/detail LLE Deficits / Details: s/p achilles tendon repair. Able to wiggle toes. Hip/knee AROM WFL    Cervical / Trunk Assessment Cervical / Trunk Assessment: Normal  Communication   Communication: No difficulties  Cognition Arousal/Alertness: Awake/alert Behavior During Therapy: WFL for tasks assessed/performed Overall Cognitive Status: Within Functional Limits for tasks assessed                                        General Comments      Exercises Other Exercises Other Exercises: Standing: L  hip flexion, extension, abduction x 10 each, hamstring curls x 10   Assessment/Plan    PT Assessment Patent does not need any further PT services  PT Problem List         PT Treatment Interventions      PT Goals (Current goals can be found in the Care Plan section)  Acute Rehab PT Goals Patient Stated Goal: go home PT Goal Formulation: All assessment and education complete, DC therapy    Frequency     Barriers to discharge        Co-evaluation                AM-PAC PT "6 Clicks" Mobility  Outcome Measure Help needed turning from your back to your side while in a flat bed without using bedrails?: None Help needed moving from lying on your back to sitting on the side of a flat bed without using bedrails?: None Help needed moving to and from a bed to a chair (including a wheelchair)?: None Help needed standing up from a chair using your arms (e.g., wheelchair or bedside chair)?: None Help needed to walk in hospital room?: None Help needed climbing 3-5 steps with a railing? : None 6 Click Score: 24    End of Session   Activity Tolerance: Patient tolerated treatment well Patient left: in bed;with call bell/phone within reach Nurse Communication: Mobility status PT Visit Diagnosis: Pain Pain - Right/Left: Left Pain - part of body: Ankle and joints of foot    Time: 0811-0839 PT Time Calculation (min) (ACUTE ONLY): 28 min   Charges:   PT Evaluation $PT Eval Low Complexity: 1 Low PT Treatments $Therapeutic Exercise: 8-22 mins          Lillia Pauls, PT, DPT Acute Rehabilitation Services Pager [phone] Office [phone]   Norval Morton 09/25/2019, 9:19 AM

## 2019-10-02 ENCOUNTER — Ambulatory Visit (INDEPENDENT_AMBULATORY_CARE_PROVIDER_SITE_OTHER): Payer: PRIVATE HEALTH INSURANCE | Admitting: Surgery

## 2019-10-02 ENCOUNTER — Encounter: Payer: Self-pay | Admitting: Surgery

## 2019-10-02 ENCOUNTER — Other Ambulatory Visit: Payer: Self-pay

## 2019-10-02 VITALS — Ht 71.0 in | Wt 220.0 lb

## 2019-10-02 DIAGNOSIS — Z9889 Other specified postprocedural states: Secondary | ICD-10-CM

## 2019-10-02 NOTE — Progress Notes (Signed)
   Post-Op Visit Note   Patient: Evan Wiley           Date of Birth: 1980-08-06           MRN: [id number] Visit Date: 10/02/2019 PCP: Patient, No Pcp Per   Assessment & Plan:  Chief Complaint:  Chief Complaint  Patient presents with  . Left Ankle - Routine Post Op   39 year old  male who is 1 week status post left Achilles tendon repair returns.  States he is doing well.  Not taking much pain medication.  He admits full weightbearing in his left lower extremity splint.  He was given instructions to be strict nonweightbearing.    Visit Diagnoses:  1. S/P Achilles tendon repair     Plan: new splint applied.  Strict NWB left foot.  Stressed to patient the importance of being compliant and noncompliance could lead to further injury and need for repeat surgery.  Voices under follow up one week for possible staple removal.   Follow-Up Instructions: Return in about 1 week (around 10/09/2019) for with dr yates postop check. .   Orders:  No orders of the defined types were placed in this encounter.  No orders of the defined types were placed in this encounter.   Imaging: No results found.  PMFS History: Patient Active Problem List   Diagnosis Date Noted  . Achilles tendon rupture, left, initial encounter 09/24/2019  . Complete rupture of left Achilles tendon 09/24/2019   Past Medical History:  Diagnosis Date  . Asthma    no inhaler  . Bipolar disorder (HCC)    no meds  . Heart murmur    never has caused any problems, dx as a child  . Reactive airway disease    no inhaler  . Schizophrenia (H[facility])    no meds    Family History  Problem Relation Age of Onset  . Hypertension Mother   . Diabetes Mother   . Diabetes Father   . Hypertension Father     Past Surgical History:  Procedure Laterality Date  . ACHILLES TENDON SURGERY Left 09/24/2019   Procedure: LEFT ACHILLES TENDON REPAIR;  Surgeon: Eldred Manges, MD;  Location: [facility] OR;  Service: Orthopedics;   Laterality: Left;  . FACIAL COSMETIC SURGERY    . FRACTURE SURGERY    . WISDOM TOOTH EXTRACTION     Social History   Occupational History  . Not on file  Tobacco Use  . Smoking status: Current Every Day Smoker    Packs/day: 1.00    Years: 27.00    Pack years: 27.00    Types: Cigarettes  . Smokeless tobacco: Never Used  Substance and Sexual Activity  . Alcohol use: Not Currently    Comment: End of April 2021  . Drug use: Not Currently    Types: Marijuana    Comment: last use - End of April 2021  . Sexual activity: Yes    Birth control/protection: None   Exam Patient does have some swelling foot and ankle although not extreme.  No drainage or signs infection.  Staples intact.  Wound looks good.  Calf nontender.

## 2019-10-07 ENCOUNTER — Encounter: Payer: Self-pay | Admitting: Surgery

## 2019-10-07 ENCOUNTER — Telehealth: Payer: Self-pay | Admitting: Orthopaedic Surgery

## 2019-10-07 NOTE — Telephone Encounter (Signed)
Patient called.   He was told to return in one week to have his staples removed but had to call later because he needed to check his work schedule first. Ophelia Charter' next available isn't until the 15th, will need to be worked in   Call back: [phone]

## 2019-10-08 NOTE — Telephone Encounter (Signed)
Can you please call and work patient in with Dr Ophelia Charter?

## 2019-10-09 ENCOUNTER — Encounter: Payer: Self-pay | Admitting: Surgery

## 2019-10-09 ENCOUNTER — Other Ambulatory Visit: Payer: Self-pay

## 2019-10-09 ENCOUNTER — Ambulatory Visit (INDEPENDENT_AMBULATORY_CARE_PROVIDER_SITE_OTHER): Payer: PRIVATE HEALTH INSURANCE | Admitting: Surgery

## 2019-10-09 VITALS — BP 121/78 | HR 92 | Ht 71.0 in | Wt 220.0 lb

## 2019-10-09 DIAGNOSIS — Z9889 Other specified postprocedural states: Secondary | ICD-10-CM

## 2019-10-09 MED ORDER — HYDROCODONE-ACETAMINOPHEN 7.5-325 MG PO TABS: 1.0000 | ORAL_TABLET | Freq: Four times a day (QID) | ORAL | 0 refills | Status: DC | PRN

## 2019-10-09 NOTE — Progress Notes (Signed)
39 year old black male who is a about 2 weeks status post left Achilles tendon repair returns were supranormal.  States that he is doing well.  Needs refill of pain medication.  Exam Wound looks good.  Swelling much improved from previous visit.  Staples removed and Steri-Strips applied.  Incision clean well without signs of infection.  Calf nontender.  Plan Today patient was put into a cam boot.  I still would like him to be nonweightbearing with crutches to help give this more time to heal without him stressing it.  Follow-up in 2 weeks with Dr. Ophelia Charter and he can make decision at that time as to whether or not he wants him to start weightbearing or doing some gentle range of motion exercises.  Again I advised patient I do not want him doing either at this point.  All questions answered.  I sent in prescription for Norco to his pharmacy.

## 2019-10-14 NOTE — Discharge Summary (Signed)
Patient ID: Evan Wiley MRN: [id number] DOB/AGE: [birth year] 39 y.o.  Admit date: 09/24/2019 Discharge date: Sep 25, 2019 Admission Diagnoses:  Active Problems:   Achilles tendon rupture, left, initial encounter   Complete rupture of left Achilles tendon   Discharge Diagnoses:  Active Problems:   Achilles tendon rupture, left, initial encounter   Complete rupture of left Achilles tendon  status post Procedure(s): LEFT ACHILLES TENDON REPAIR  Past Medical History:  Diagnosis Date  . Asthma    no inhaler  . Bipolar disorder ([place])    no meds  . Heart murmur    never has caused any problems, dx as a child  . Reactive airway disease    no inhaler  . Schizophrenia ([place])    no meds    Surgeries: Procedure(s): LEFT ACHILLES TENDON REPAIR on 09/24/2019   Consultants:   Discharged Condition: Improved  Hospital Course: Evan Wiley is an 39 y.o. male who was admitted 09/24/2019 for operative treatment of left Achilles tendon rupture. Patient failed conservative treatments (please see the history and physical for the specifics) and had severe unremitting pain that affects sleep, daily activities and work/hobbies. After pre-op clearance, the patient was taken to the operating room on 09/24/2019 and underwent  Procedure(s): [place].    Patient was given perioperative antibiotics:  Anti-infectives (From admission, onward)   Start     Dose/Rate Route Frequency Ordered Stop   09/24/19 2200  ceFAZolin (ANCEF) IVPB 1 g/50 mL premix  Status:  Discontinued     1 g 100 mL/hr over 30 Minutes Intravenous Every 8 hours 09/24/19 1845 09/25/19 1521   09/24/19 1500  ceFAZolin (ANCEF) IVPB 2g/100 mL premix     2 g 200 mL/hr over 30 Minutes Intravenous On call to O.R. 09/24/19 1442 09/24/19 1621   09/24/19 1444  ceFAZolin (ANCEF) 2-4 GM/100ML-% IVPB    Note to Pharmacy: [name]   : cabinet override      09/24/19 1444 09/24/19 1632       Patient  was given sequential compression devices and early ambulation to prevent DVT.   Patient benefited maximally from hospital stay and there were no complications. At the time of discharge, the patient was urinating/moving their bowels without difficulty, tolerating a regular diet, pain is controlled with oral pain medications and they have been cleared by PT/OT.   Recent vital signs: No data found.   Recent laboratory studies: No results for input(s): WBC, HGB, HCT, PLT, NA, K, CL, CO2, BUN, CREATININE, GLUCOSE, INR, CALCIUM in the last 72 hours.  Invalid input(s): PT, 2   Discharge Medications:   Allergies as of 09/25/2019      Reactions   Bee Venom Anaphylaxis      Medication List    STOP taking these medications   HYDROcodone-acetaminophen 5-325 MG tablet Commonly known as: NORCO/VICODIN     TAKE these medications   multivitamin with minerals Tabs tablet Take 1 tablet by mouth daily.   naproxen sodium 220 MG tablet Commonly known as: ALEVE Take 220-440 mg by mouth 2 (two) times daily as needed (pain).   oxyCODONE-acetaminophen 5-325 MG tablet Commonly known as: Percocet Take 1 tablet by mouth every 4 (four) hours as needed for severe pain. Post op pain       Diagnostic Studies: US Guided Needle Placement - No Linked Charges  Result Date: 09/19/2019 Limited diagnostic ultrasound left Achilles: The attachment on the calcaneus is intact.  Moving proximally, there is swelling  of the tendon with hypoechoic changes.  About 7 to 8 cm proximal to the calcaneus there is a high-grade tear.  The ends of the tendon are about 1 cm apart.  I do not think there is continuity with dynamic imaging.  VAS [place] LOWER EXTREMITY VENOUS (DVT)  Result Date: 09/24/2019  Lower Venous DVT Study Indications: Pain, Swelling, and Edema.  Risk Factors: Surgery Evaluation prior to Achilles tendon repair left leg. Performing Technologist: Hardie Lora RVT  Examination Guidelines: A complete evaluation  includes B-mode imaging, spectral Doppler, color Doppler, and power Doppler as needed of all accessible portions of each vessel. Bilateral testing is considered an integral part of a complete examination. Limited examinations for reoccurring indications may be performed as noted. The reflux portion of the exam is performed with the patient in reverse Trendelenburg.  +-----+---------------+---------+-----------+----------+--------------+ RIGHTCompressibilityPhasicitySpontaneityPropertiesThrombus Aging +-----+---------------+---------+-----------+----------+--------------+ CFV  Full           Yes      Yes                                 +-----+---------------+---------+-----------+----------+--------------+   +---------+---------------+---------+-----------+----------+--------------+ LEFT     CompressibilityPhasicitySpontaneityPropertiesThrombus Aging +---------+---------------+---------+-----------+----------+--------------+ CFV      Full           Yes      Yes                                 +---------+---------------+---------+-----------+----------+--------------+ SFJ      Full           Yes      Yes                                 +---------+---------------+---------+-----------+----------+--------------+ FV Prox  Full           Yes      Yes                                 +---------+---------------+---------+-----------+----------+--------------+ FV Mid   Full           Yes      Yes                                 +---------+---------------+---------+-----------+----------+--------------+ FV DistalFull           Yes      Yes                                 +---------+---------------+---------+-----------+----------+--------------+ PFV      Full                    Yes                                 +---------+---------------+---------+-----------+----------+--------------+ POP      Full           Yes      Yes                                  +---------+---------------+---------+-----------+----------+--------------+  PTV      Full                    Yes                                 +---------+---------------+---------+-----------+----------+--------------+ PERO     Full                    Yes                                 +---------+---------------+---------+-----------+----------+--------------+ GSV      Full                    Yes                                 +---------+---------------+---------+-----------+----------+--------------+ SSV      Full                    Yes                                 +---------+---------------+---------+-----------+----------+--------------+     Summary: RIGHT: - No evidence of deep vein thrombosis in the lower extremity. No indirect evidence of obstruction proximal to the inguinal ligament.  LEFT: - There is no evidence of deep vein thrombosis in the lower extremity. - There is no evidence of superficial venous thrombosis.  - No cystic structure found in the popliteal fossa.  *See table(s) above for measurements and observations. Electronically signed by Waverly Ferrari MD on 09/24/2019 at 1:32:18 PM.    Final       Follow-up Information    Eldred Manges, MD Follow up in 1 week(s).   Specialty: Orthopedic Surgery Contact information: [address] [place] [address] [phone]           Discharge Plan:  discharge to home Disposition:     Signed: Zonia Kief  10/14/2019, 3:11 PM

## 2020-01-30 ENCOUNTER — Other Ambulatory Visit: Payer: Self-pay

## 2020-01-30 ENCOUNTER — Emergency Department (HOSPITAL_COMMUNITY)
Admission: EM | Admit: 2020-01-30 | Discharge: 2020-01-30 | Disposition: A | Discharge status: Home / Self Care | Payer: PRIVATE HEALTH INSURANCE | Attending: Emergency Medicine | Admitting: Emergency Medicine

## 2020-01-30 ENCOUNTER — Encounter (HOSPITAL_COMMUNITY): Payer: Self-pay

## 2020-01-30 DIAGNOSIS — J45909 Unspecified asthma, uncomplicated: Secondary | ICD-10-CM | POA: Insufficient documentation

## 2020-01-30 DIAGNOSIS — F17210 Nicotine dependence, cigarettes, uncomplicated: Secondary | ICD-10-CM | POA: Insufficient documentation

## 2020-01-30 DIAGNOSIS — Z202 Contact with and (suspected) exposure to infections with a predominantly sexual mode of transmission: Secondary | ICD-10-CM | POA: Insufficient documentation

## 2020-01-30 MED ORDER — STERILE WATER FOR INJECTION IJ SOLN
INTRAMUSCULAR | Status: AC
  Administered 2020-01-30: 10 mL
  Filled 2020-01-30: qty 10

## 2020-01-30 MED ORDER — AZITHROMYCIN 250 MG PO TABS
1000.0000 mg | ORAL_TABLET | Freq: Once | ORAL | Status: AC
  Administered 2020-01-30: 1000 mg via ORAL
  Filled 2020-01-30: qty 4

## 2020-01-30 MED ORDER — METRONIDAZOLE 500 MG PO TABS
2000.0000 mg | ORAL_TABLET | Freq: Once | ORAL | Status: AC
  Administered 2020-01-30: 2000 mg via ORAL
  Filled 2020-01-30: qty 4

## 2020-01-30 MED ORDER — CEFTRIAXONE SODIUM 250 MG IJ SOLR
250.0000 mg | Freq: Once | INTRAMUSCULAR | Status: AC
  Administered 2020-01-30: 250 mg via INTRAMUSCULAR
  Filled 2020-01-30: qty 250

## 2020-01-30 NOTE — Discharge Instructions (Addendum)
We will contact you with the results of your testing when it is available if it is positive. Follow-up with your primary care provider or the 1 listed below. Return to the ER if you start to experience abdominal pain, chest pain, shortness of breath.

## 2020-01-30 NOTE — ED Triage Notes (Signed)
Patient states he had unprotected sex with 3 women. Patient states one of the women informed him tht she was + for trichomonas.

## 2020-01-30 NOTE — ED Provider Notes (Signed)
[facility]-EMERGENCY DEPT Provider Note   CSN: [id number] Arrival date & time: 01/30/20  0827     History Chief Complaint  Patient presents with  . Exposure to STD    Evan Wiley is a 39 y.o. male with a past medical history of asthma, schizophrenia presenting to the ED concern for STDs.  States that he had unprotected sexual intercourse with several women and was told that one of them was positive for trichomonas.  Reports some irritation near his genital area since yesterday.  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  He denies any abdominal pain, fever, other environmental exposures, dysuria, hematuria or penile discharge.  HPI     Past Medical History:  Diagnosis Date  . Asthma    no inhaler  . Bipolar disorder (HCC)    no meds  . Heart murmur    never has caused any problems, dx as a child  . Reactive airway disease    no inhaler  . Schizophrenia (HCC)    no meds    Patient Active Problem List   Diagnosis Date Noted  . Achilles tendon rupture, left, initial encounter 09/24/2019  . Complete rupture of left Achilles tendon 09/24/2019    Past Surgical History:  Procedure Laterality Date  . ACHILLES TENDON SURGERY Left 09/24/2019   Procedure: LEFT ACHILLES TENDON REPAIR;  Surgeon: Eldred Manges, MD;  Location: [facility] OR;  Service: Orthopedics;  Laterality: Left;  . FACIAL COSMETIC SURGERY    . FRACTURE SURGERY    . WISDOM TOOTH EXTRACTION         Family History  Problem Relation Age of Onset  . Hypertension Mother   . Diabetes Mother   . Diabetes Father   . Hypertension Father     Social History   Tobacco Use  . Smoking status: Current Every Day Smoker    Packs/day: 0.50    Years: 27.00    Pack years: 13.50    Types: Cigarettes  . Smokeless tobacco: Never Used  Vaping Use  . Vaping Use: Never used  Substance Use Topics  . Alcohol use: Yes  . Drug use: Yes    Types: Marijuana    Home Medications Prior to  Admission medications   Medication Sig Start Date End Date Taking? Authorizing Provider  HYDROcodone-acetaminophen (NORCO) 7.5-325 MG tablet Take 1 tablet by mouth every 6 (six) hours as needed for moderate pain. 10/09/19   Naida Sleight, PA-C  Multiple Vitamin (MULTIVITAMIN WITH MINERALS) TABS tablet Take 1 tablet by mouth daily.    [provider]  naproxen sodium (ALEVE) 220 MG tablet Take 220-440 mg by mouth 2 (two) times daily as needed (pain).     [provider]  oxyCODONE-acetaminophen (PERCOCET) 5-325 MG tablet Take 1 tablet by mouth every 4 (four) hours as needed for severe pain. Post op pain 09/25/19 09/24/20  Eldred Manges, MD    Allergies    Bee venom  Review of Systems   Review of Systems  Constitutional: Negative for chills and fever.  Gastrointestinal: Negative for abdominal pain.  Genitourinary: Negative for discharge.  Skin: Positive for rash.  Neurological: Negative for weakness and numbness.    Physical Exam Updated Vital Signs BP 136/87 (BP Location: Left Arm)   Pulse 82   Temp 98.4 F (36.9 C) (Oral)   Resp 16   Ht 5\' 11"  (1.803 m)   Wt 99.8 kg   SpO2 95%   BMI 30.68 kg/m  Physical Exam Vitals and nursing note reviewed. Exam conducted with a chaperone present.  Constitutional:      General: He is not in acute distress.    Appearance: He is well-developed. He is not diaphoretic.  HENT:     Head: Normocephalic and atraumatic.  Eyes:     General: No scleral icterus.    Conjunctiva/sclera: Conjunctivae normal.  Pulmonary:     Effort: Pulmonary effort is normal. No respiratory distress.  Genitourinary:    Penis: Circumcised.      Testes:        Right: Tenderness not present.        Left: Tenderness not present.     Comments: Normal male genitalia noted. Penis, scrotum, and testicles without swelling, lesions, rashes, or tenderness present. No penile discharge noted. Cremasteric reflex intact. RN served as Biomedical engineer during the exam.    Musculoskeletal:     Cervical back: Normal range of motion.  Skin:    Findings: No rash.  Neurological:     Mental Status: He is alert.     ED Results / Procedures / Treatments   Labs (all labs ordered are listed, but only abnormal results are displayed) Labs Reviewed  GC/CHLAMYDIA PROBE AMP ([place]) NOT AT [facility]    EKG None  Radiology No results found.  Procedures Procedures (including critical care time)  Medications Ordered in ED Medications  metroNIDAZOLE (FLAGYL) tablet 2,000 mg (has no administration in time range)  cefTRIAXone (ROCEPHIN) injection 250 mg (has no administration in time range)  azithromycin (ZITHROMAX) tablet 1,000 mg (has no administration in time range)  sterile water (preservative free) injection (has no administration in time range)    ED Course  I have reviewed the triage vital signs and the nursing notes.  Pertinent labs & imaging results that were available during my care of the patient were reviewed by me and considered in my medical decision making (see chart for details).    MDM Rules/Calculators/A&P                          39 year old male presenting to the ED concern for exposure to trichomonas.  He would like to be treated for this as well as gonorrhea and chlamydia.  Denies any dysuria, penile discharge or abdominal pain.  GU exam without any abnormalities.  Will treat with Flagyl, Rocephin and azithromycin.  Urine GC chlamydia screen sent.  Educated on abstaining from sexual activity for 7 days and following up with lab work when available.  Patient is agreeable to the plan.  Patient is hemodynamically stable, in NAD, and able to ambulate in the ED. Evaluation does not show pathology that would require ongoing emergent intervention or inpatient treatment. I explained the diagnosis to the patient. Pain has been managed and has no complaints prior to discharge. Patient is comfortable with above plan and is stable for discharge at  this time. All questions were answered prior to disposition. Strict return precautions for returning to the ED were discussed. Encouraged follow up with PCP.   An After Visit Summary was printed and given to the patient.   Portions of this note were generated with Scientist, clinical (histocompatibility and immunogenetics). Dictation errors may occur despite best attempts at proofreading.  Final Clinical Impression(s) / ED Diagnoses Final diagnoses:  Exposure to STD    Rx / DC Orders ED Discharge Orders    None       Dietrich Pates, [place] 01/30/20 7062  Linwood Dibbles, MD 01/30/20 [phone]

## 2020-02-02 LAB — GC/CHLAMYDIA PROBE AMP (~~LOC~~) NOT AT ARMC
Chlamydia: NEGATIVE
Comment: NEGATIVE
Comment: NORMAL
Neisseria Gonorrhea: NEGATIVE

## 2020-03-31 ENCOUNTER — Emergency Department (HOSPITAL_COMMUNITY)
Admission: EM | Admit: 2020-03-31 | Discharge: 2020-03-31 | Disposition: A | Discharge status: Home / Self Care | Payer: Self-pay | Attending: Emergency Medicine | Admitting: Emergency Medicine

## 2020-03-31 ENCOUNTER — Other Ambulatory Visit: Payer: Self-pay

## 2020-03-31 DIAGNOSIS — F17210 Nicotine dependence, cigarettes, uncomplicated: Secondary | ICD-10-CM | POA: Insufficient documentation

## 2020-03-31 DIAGNOSIS — J029 Acute pharyngitis, unspecified: Secondary | ICD-10-CM | POA: Insufficient documentation

## 2020-03-31 DIAGNOSIS — J45909 Unspecified asthma, uncomplicated: Secondary | ICD-10-CM | POA: Insufficient documentation

## 2020-03-31 DIAGNOSIS — Z202 Contact with and (suspected) exposure to infections with a predominantly sexual mode of transmission: Secondary | ICD-10-CM | POA: Insufficient documentation

## 2020-03-31 LAB — URINALYSIS, ROUTINE W REFLEX MICROSCOPIC
Bilirubin Urine: NEGATIVE
Glucose, UA: NEGATIVE mg/dL
Hgb urine dipstick: NEGATIVE
Ketones, ur: NEGATIVE mg/dL
Leukocytes,Ua: NEGATIVE
Nitrite: NEGATIVE
Protein, ur: NEGATIVE mg/dL
Specific Gravity, Urine: 1.023 (ref 1.005–1.030)
pH: 5 (ref 5.0–8.0)

## 2020-03-31 LAB — GROUP A STREP BY PCR: Group A Strep by PCR: NOT DETECTED

## 2020-03-31 MED ORDER — METRONIDAZOLE 500 MG PO TABS
2000.0000 mg | ORAL_TABLET | Freq: Once | ORAL | Status: AC
  Administered 2020-03-31: 2000 mg via ORAL
  Filled 2020-03-31: qty 4

## 2020-03-31 NOTE — ED Notes (Signed)
An After Visit Summary was printed and given to the patient. Discharge instructions given and no further questions at this time.  

## 2020-03-31 NOTE — ED Triage Notes (Signed)
Pt arrived via POV, c/o sore throat and concern for STD. Denies any penile discharge, painful urination or any issues, but states he knows of an STD hx in partner.

## 2020-03-31 NOTE — ED Provider Notes (Signed)
[facility]-EMERGENCY DEPT Provider Note   CSN: [id number] Arrival date & time: 03/31/20  1308     History Chief Complaint  Patient presents with  . Sore Throat  . Exposure to STD    Evan Wiley is a 39 y.o. male.  Patient presents to the emergency department for evaluation of sore throat and for STD testing.  Patient states that he awoke with a sore throat this morning.  He is able to swallow improved without difficulty.  He describes a tightness in his neck.  He is able to look left and right, up and down without any difficulties.  No fevers, nausea or vomiting.  He denies penile discharge, drainage, dysuria.  He does report having sex with a partner recently known to have trichomonas.  He denies engaging in oral sex. Patient was treated for trichomonas about a month ago, however he states that he is not sure that the partner received treatment.  He is concerned that he may have been exposed again.  No fevers, ear pain, runny nose, cough, chest pain, shortness of breath.  No treatments prior to arrival. Neg test for [facility] and Chlamydia 01/30/20.         Past Medical History:  Diagnosis Date  . Asthma    no inhaler  . Bipolar disorder (HCC)    no meds  . Heart murmur    never has caused any problems, dx as a child  . Reactive airway disease    no inhaler  . Schizophrenia (HCC)    no meds    Patient Active Problem List   Diagnosis Date Noted  . Achilles tendon rupture, left, initial encounter 09/24/2019  . Complete rupture of left Achilles tendon 09/24/2019    Past Surgical History:  Procedure Laterality Date  . ACHILLES TENDON SURGERY Left 09/24/2019   Procedure: LEFT ACHILLES TENDON REPAIR;  Surgeon: Eldred Manges, MD;  Location: [facility] OR;  Service: Orthopedics;  Laterality: Left;  . FACIAL COSMETIC SURGERY    . FRACTURE SURGERY    . WISDOM TOOTH EXTRACTION         Family History  Problem Relation Age of Onset  . Hypertension Mother     . Diabetes Mother   . Diabetes Father   . Hypertension Father     Social History   Tobacco Use  . Smoking status: Current Every Day Smoker    Packs/day: 0.50    Years: 27.00    Pack years: 13.50    Types: Cigarettes  . Smokeless tobacco: Never Used  Vaping Use  . Vaping Use: Never used  Substance Use Topics  . Alcohol use: Yes  . Drug use: Yes    Types: Marijuana    Home Medications Prior to Admission medications   Medication Sig Start Date End Date Taking? Authorizing Provider  HYDROcodone-acetaminophen (NORCO) 7.5-325 MG tablet Take 1 tablet by mouth every 6 (six) hours as needed for moderate pain. 10/09/19   Naida Sleight, PA-C  Multiple Vitamin (MULTIVITAMIN WITH MINERALS) TABS tablet Take 1 tablet by mouth daily.    [provider]  naproxen sodium (ALEVE) 220 MG tablet Take 220-440 mg by mouth 2 (two) times daily as needed (pain).     [provider]  oxyCODONE-acetaminophen (PERCOCET) 5-325 MG tablet Take 1 tablet by mouth every 4 (four) hours as needed for severe pain. Post op pain 09/25/19 09/24/20  Eldred Manges, MD    Allergies    Bee venom  Review of Systems   Review of Systems  Constitutional: Negative for fever.  HENT: Positive for sore throat.   Eyes: Negative for discharge.  Gastrointestinal: Negative for rectal pain.  Genitourinary: Negative for discharge, dysuria, frequency, genital sores, penile pain and testicular pain.  Musculoskeletal: Negative for arthralgias.  Skin: Negative for rash.  Hematological: Negative for adenopathy.    Physical Exam Updated Vital Signs BP (!) 131/98 (BP Location: Left Arm)   Pulse 84   Temp 98.5 F (36.9 C) (Oral)   Resp 16   SpO2 98%   Physical Exam Vitals and nursing note reviewed.  Constitutional:      Appearance: He is well-developed.  HENT:     Head: Normocephalic and atraumatic.     Mouth/Throat:     Mouth: Mucous membranes are moist. No oral lesions.     Pharynx: Posterior  oropharyngeal erythema (mild) present. No pharyngeal swelling, oropharyngeal exudate or uvula swelling.     Comments: No trismus.  Eyes:     Conjunctiva/sclera: Conjunctivae normal.  Neck:     Comments: Full ROM in all directions.  Pulmonary:     Effort: No respiratory distress.  Musculoskeletal:     Cervical back: Normal range of motion and neck supple.  Skin:    General: Skin is warm and dry.  Neurological:     Mental Status: He is alert.     ED Results / Procedures / Treatments   Labs (all labs ordered are listed, but only abnormal results are displayed) Labs Reviewed  GROUP A STREP BY PCR  URINALYSIS, ROUTINE W REFLEX MICROSCOPIC  GC/CHLAMYDIA PROBE AMP ([place]) NOT AT [facility]    EKG None  Radiology No results found.  Procedures Procedures (including critical care time)  Medications Ordered in ED Medications  metroNIDAZOLE (FLAGYL) tablet 2,000 mg (2,000 mg Oral Given 03/31/20 1444)    ED Course  I have reviewed the triage vital signs and the nursing notes.  Pertinent labs & imaging results that were available during my care of the patient were reviewed by me and considered in my medical decision making (see chart for details).  Patient seen and examined. Will check for strep and check UA for trich, probe for GC/chlamydia. Will treat for trich given concern for exposure.   Vital signs reviewed and are as follows: BP (!) 131/98 (BP Location: Left Arm)   Pulse 84   Temp 98.5 F (36.9 C) (Oral)   Resp 16   SpO2 98%   Patient requesting d/c, prior to strep test resulting. Instructions given.      MDM Rules/Calculators/A&P                          Possible STD exposure: Pt is concerned for trichomonas, treated.  Testing for gonorrhea and chlamydia is pending.  Patient denies oral sex, doubt pharyngeal gonorrhea.  Sore throat: Strep test pending.  Conservative measures if negative.  No signs of peritonsillar abscess, retropharyngeal abscess,  epiglottitis.   Final Clinical Impression(s) / ED Diagnoses Final diagnoses:  Possible exposure to STD  Sore throat    Rx / DC Orders ED Discharge Orders    None       Renne Crigler, PA-C 03/31/20 1550    Tegeler, Canary Brim, MD 03/31/20 [phone]

## 2020-03-31 NOTE — Discharge Instructions (Signed)
Please read and follow all provided instructions.  Your diagnoses today include:  1. Possible exposure to STD   2. Sore throat     Tests performed today include:  Test for gonorrhea and chlamydia.   Urine test - no sign of infection or trichomonas  Strep test  Vital signs. See below for your results today.   Medications:  You were given metronidazole (pills) that treat for trichomonas.   Home care instructions:  Read educational materials contained in this packet and follow any instructions provided.   You should tell your partners about your infection and avoid having sex for one week to allow time for the medicine to work.  Sexually transmitted disease testing also available at:   [facility] [place], [facility]  [address] [place], phone [phone] or [phone]    Monday - Friday, call for an appointment  Return instructions:   Please return to the Emergency Department if you experience worsening symptoms.   Please return if you have any other emergent concerns.  Additional Information:  Your vital signs today were: BP (!) 131/98 (BP Location: Left Arm)   Pulse 84   Temp 98.5 F (36.9 C) (Oral)   Resp 16   SpO2 98%  If your blood pressure (BP) was elevated above 135/85 this visit, please have this repeated by your doctor within one month. --------------

## 2020-03-31 NOTE — ED Notes (Signed)
Clarified with Josh PA, swab is not needed

## 2020-07-30 ENCOUNTER — Ambulatory Visit: Payer: Self-pay

## 2020-12-25 IMAGING — CR DG ANKLE COMPLETE 3+V*L*
3 series · 3 of 3 positions shown · non-contrast
Comparison: 12/06/2010

CLINICAL DATA: Ankle pain, heard pop while running.

EXAM:
LEFT ANKLE COMPLETE - 3+ VIEW

[x ankle ap left]
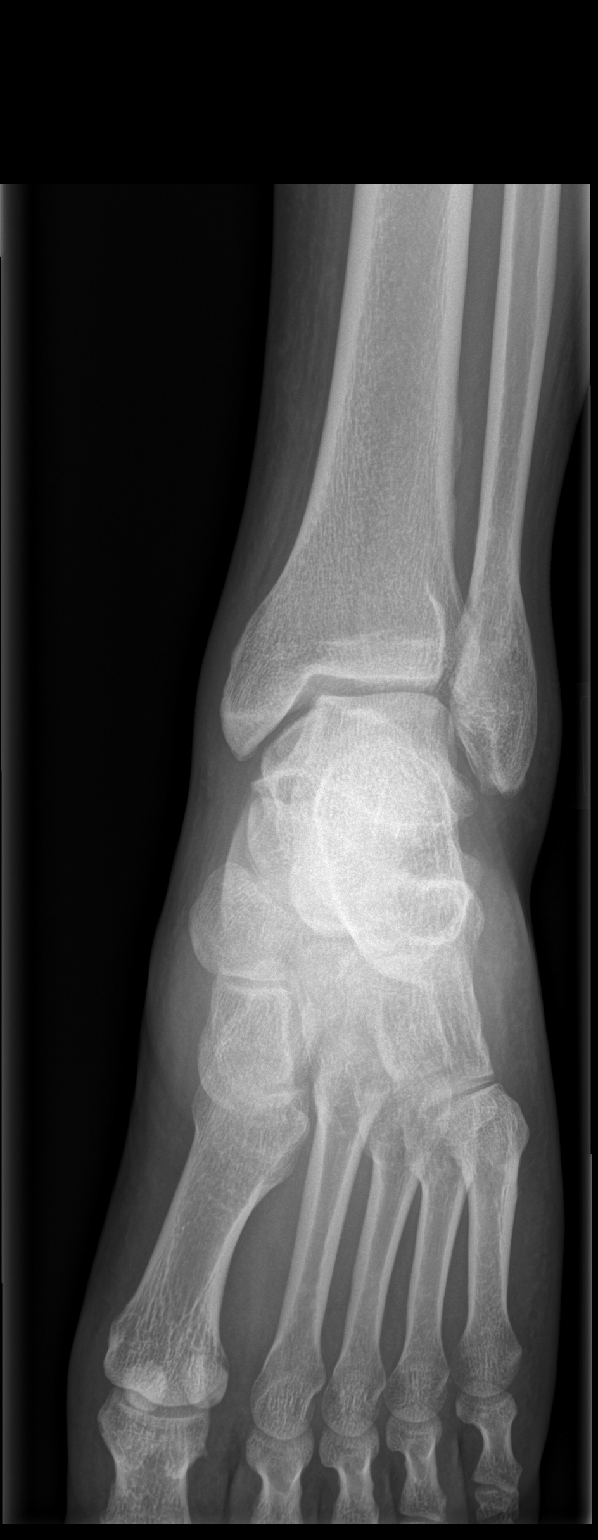

[x ankle obl left]
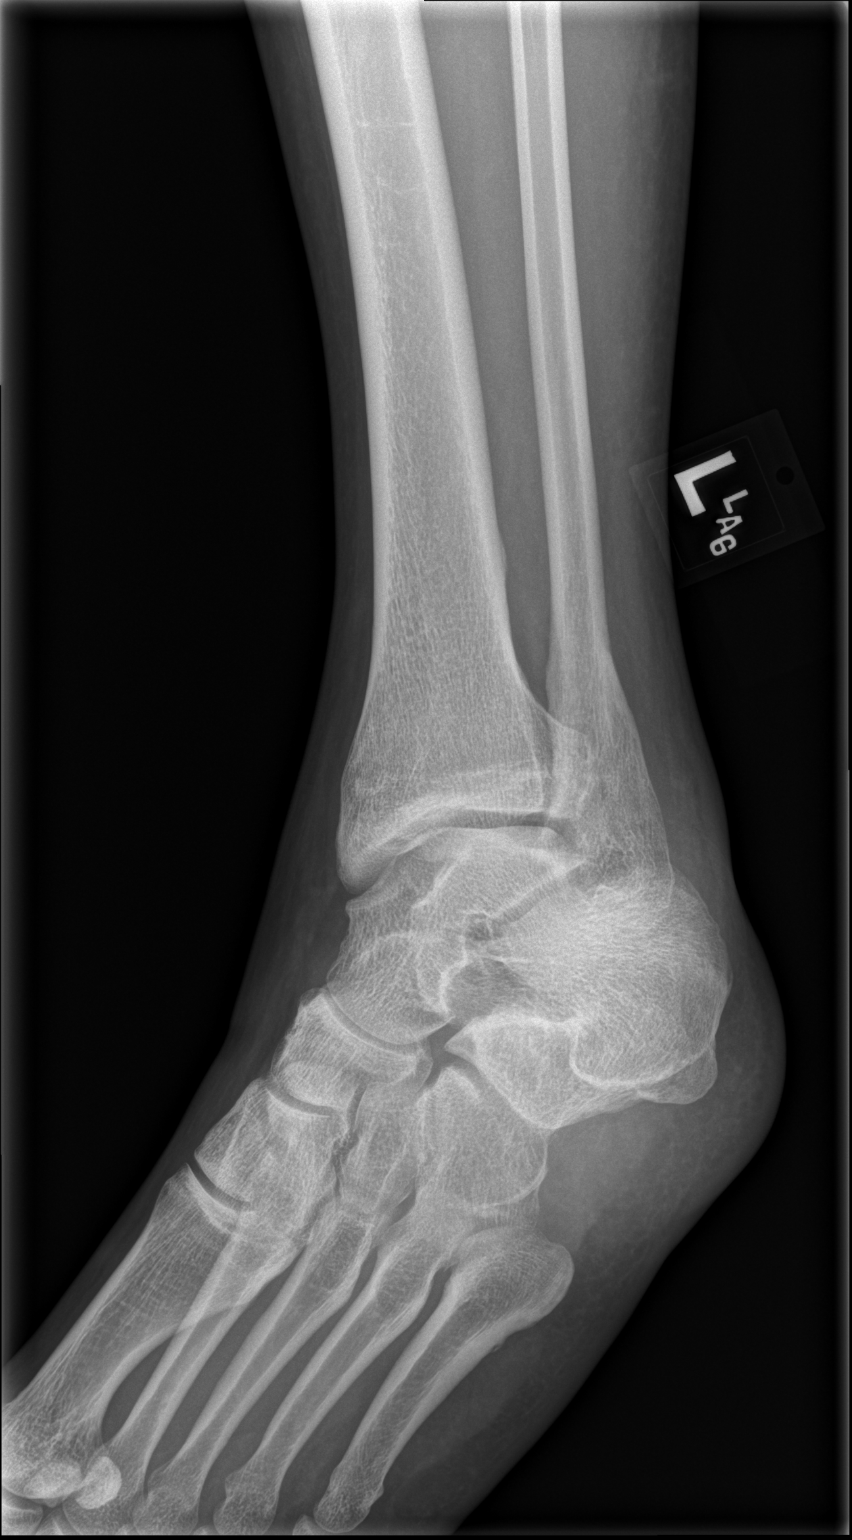

[x ankle lat left]
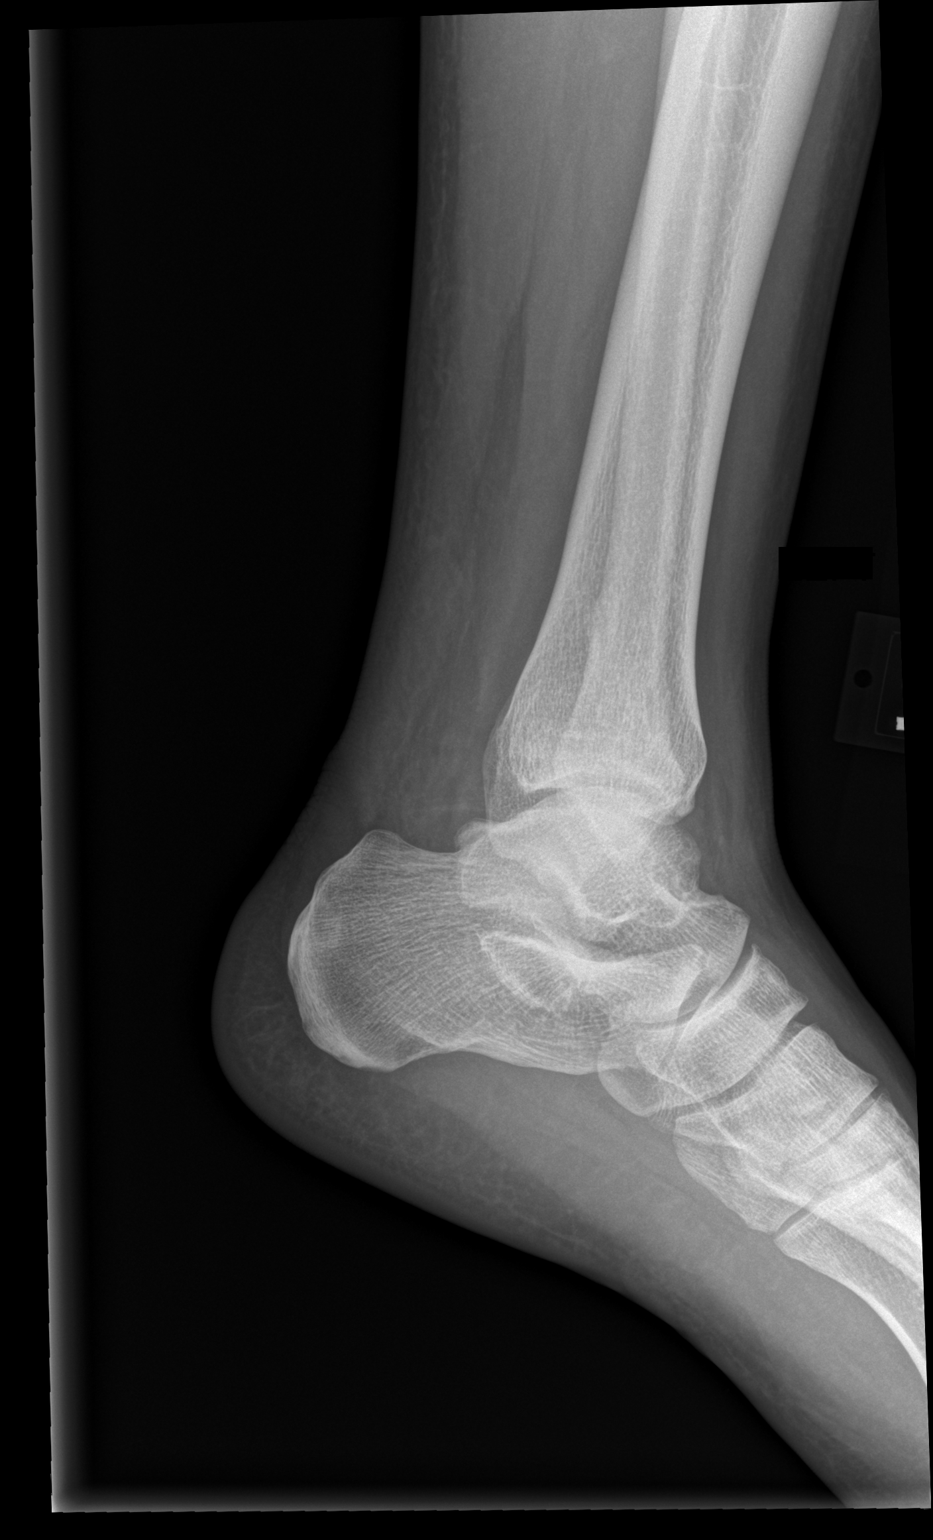

[3 of 3 positions shown; findings below may reference images not displayed]

FINDINGS: Old healed distal fibular fracture. No acute fracture, subluxation
or dislocation. Joint spaces are maintained. Soft tissues are
intact.
IMPRESSION: No acute bony abnormality.

## 2021-05-24 ENCOUNTER — Encounter (HOSPITAL_COMMUNITY): Payer: Self-pay | Admitting: Emergency Medicine

## 2021-05-24 ENCOUNTER — Other Ambulatory Visit: Payer: Self-pay

## 2021-05-24 ENCOUNTER — Emergency Department (HOSPITAL_COMMUNITY): Payer: Self-pay

## 2021-05-24 ENCOUNTER — Emergency Department (HOSPITAL_COMMUNITY)
Admission: EM | Admit: 2021-05-24 | Discharge: 2021-05-24 | Disposition: A | Discharge status: Home / Self Care | Payer: Self-pay | Attending: Emergency Medicine | Admitting: Emergency Medicine

## 2021-05-24 DIAGNOSIS — Z5321 Procedure and treatment not carried out due to patient leaving prior to being seen by health care provider: Secondary | ICD-10-CM | POA: Insufficient documentation

## 2021-05-24 DIAGNOSIS — M25572 Pain in left ankle and joints of left foot: Secondary | ICD-10-CM | POA: Insufficient documentation

## 2021-05-24 NOTE — ED Provider Triage Note (Signed)
Emergency Medicine Provider Triage Evaluation Note  Evan Wiley , a 41 y.o. male  was evaluated in triage.  Pt complains of left ankle pain.  About a year ago patient had an Achilles tendon repair on his left leg.  He states that he has been continuing to do his at home physical therapy, and has had decreased range of motion of his ankle for the past several days.  It has also been increasingly painful. No recent illness or injury.  Review of Systems  Positive: Ankle pain Negative: Fevers, chills  Physical Exam  BP 137/83 (BP Location: Right Arm)    Pulse 76    Temp 98.1 F (36.7 C) (Oral)    Resp 20    SpO2 98%  Gen:   Awake, no distress   Resp:  Normal effort  MSK:   Moves extremities without difficulty  Other:    Medical Decision Making  Medically screening exam initiated at 4:16 PM.  Appropriate orders placed.  Evan Wiley was informed that the remainder of the evaluation will be completed by another provider, this initial triage assessment does not replace that evaluation, and the importance of remaining in the ED until their evaluation is complete.    Su Monks, PA-C 05/24/21 1617

## 2021-05-24 NOTE — ED Triage Notes (Signed)
Pt reports he had surgery on his achilles approx 1 year ago. Pt states that he is having pain in the achilles tendon and weakness in the ankle.

## 2021-05-24 NOTE — ED Notes (Addendum)
Pt stated " he was leaving and would follow up with primary care for further evaluation." Pt was advised to stay to be seen by a doctor but pt wanted to get his kids home and out of this environment due to illnesses.

## 2021-06-30 ENCOUNTER — Other Ambulatory Visit: Payer: Self-pay

## 2021-06-30 ENCOUNTER — Encounter (HOSPITAL_COMMUNITY): Payer: Self-pay | Admitting: *Deleted

## 2021-06-30 ENCOUNTER — Emergency Department (HOSPITAL_COMMUNITY)
Admission: EM | Admit: 2021-06-30 | Discharge: 2021-06-30 | Disposition: A | Discharge status: Home / Self Care | Payer: 59 | Attending: Emergency Medicine | Admitting: Emergency Medicine

## 2021-06-30 DIAGNOSIS — R369 Urethral discharge, unspecified: Secondary | ICD-10-CM | POA: Diagnosis present

## 2021-06-30 DIAGNOSIS — Z202 Contact with and (suspected) exposure to infections with a predominantly sexual mode of transmission: Secondary | ICD-10-CM | POA: Insufficient documentation

## 2021-06-30 LAB — URINALYSIS, ROUTINE W REFLEX MICROSCOPIC
Bilirubin Urine: NEGATIVE
Glucose, UA: >=500 mg/dL — AB
Hgb urine dipstick: NEGATIVE
Ketones, ur: 5 mg/dL — AB
Leukocytes,Ua: NEGATIVE
Nitrite: NEGATIVE
Protein, ur: NEGATIVE mg/dL
Specific Gravity, Urine: 1.026 (ref 1.005–1.030)
pH: 5 (ref 5.0–8.0)

## 2021-06-30 MED ORDER — DOXYCYCLINE HYCLATE 100 MG PO TABS: 100.0000 mg | ORAL_TABLET | Freq: Two times a day (BID) | ORAL | 0 refills | Status: DC

## 2021-06-30 MED ORDER — LIDOCAINE HCL (PF) 1 % IJ SOLN
1.0000 mL | Freq: Once | INTRAMUSCULAR | Status: AC
  Administered 2021-06-30: 1 mL
  Filled 2021-06-30: qty 30

## 2021-06-30 MED ORDER — CEFTRIAXONE SODIUM 1 G IJ SOLR
500.0000 mg | Freq: Once | INTRAMUSCULAR | Status: AC
  Administered 2021-06-30: 500 mg via INTRAMUSCULAR
  Filled 2021-06-30: qty 10

## 2021-06-30 MED ORDER — DOXYCYCLINE HYCLATE 100 MG PO CAPS: 100.0000 mg | ORAL_CAPSULE | Freq: Two times a day (BID) | ORAL | 0 refills | Status: AC

## 2021-06-30 NOTE — ED Provider Notes (Signed)
[facility]EMERGENCY DEPT Provider Note   CSN: [id number] Arrival date & time: 06/30/21  1123     History Chief Complaint  Patient presents with   [facility] TRANSMITTED DISEASE    Evan Wiley is a 41 y.o. male.  41 year old male with no past medical history presents to the ED with a chief complaint of STD check.  Patient reports he slept with 2 females a couple of days ago, is having some increase in discharge.  Reports noting this in his boxers.  He is currently in a committed relationship, concern for chlamydia as both of the females that he had sexual intercourse with tested positive for chlamydia.  He is without any testicular swelling, testicular pain, no penile swelling.  Donnell pain.  The history is provided by the patient and medical records.      Home Medications Prior to Admission medications   Medication Sig Start Date End Date Taking? Authorizing Provider  doxycycline (VIBRA-TABS) 100 MG tablet Take 1 tablet (100 mg total) by mouth 2 (two) times daily. 06/30/21  Yes Sirenity Shew, Leonie Douglas, PA-C  HYDROcodone-acetaminophen (NORCO) 7.5-325 MG tablet Take 1 tablet by mouth every 6 (six) hours as needed for moderate pain. 10/09/19   Naida Sleight, PA-C  Multiple Vitamin (MULTIVITAMIN WITH MINERALS) TABS tablet Take 1 tablet by mouth daily.    [provider]  naproxen sodium (ALEVE) 220 MG tablet Take 220-440 mg by mouth 2 (two) times daily as needed (pain).     [provider]      Allergies    Bee venom    Review of Systems   Review of Systems  Constitutional:  Negative for fever.  Genitourinary:  Positive for penile discharge. Negative for scrotal swelling and testicular pain.   Physical Exam Updated Vital Signs BP (!) 139/97    Pulse 92    Temp 98.5 F (36.9 C) (Oral)    Resp 18    Ht 5\' 11"  (1.803 m)    Wt 88.5 kg    SpO2 97%    BMI 27.20 kg/m  Physical Exam Vitals and nursing note reviewed.  Constitutional:       Appearance: Normal appearance.  HENT:     Head: Normocephalic and atraumatic.  Cardiovascular:     Rate and Rhythm: Normal rate.  Pulmonary:     Effort: Pulmonary effort is normal.  Abdominal:     General: Abdomen is flat.  Genitourinary:    Comments: Deferred per patient.  Musculoskeletal:     Cervical back: Normal range of motion and neck supple.  Skin:    General: Skin is warm.  Neurological:     Mental Status: He is alert and oriented to person, place, and time.    ED Results / Procedures / Treatments   Labs (all labs ordered are listed, but only abnormal results are displayed) Labs Reviewed  URINALYSIS, ROUTINE W REFLEX MICROSCOPIC - Abnormal; Notable for the following components:      Result Value   Glucose, UA >=500 (*)    Ketones, ur 5 (*)    Bacteria, UA RARE (*)    All other components within normal limits  GC/CHLAMYDIA PROBE AMP ([place]) NOT AT [facility]    EKG None  Radiology No results found.  Procedures Procedures    Medications Ordered in ED Medications  cefTRIAXone (ROCEPHIN) injection 500 mg (has no administration in time range)  lidocaine (PF) (XYLOCAINE) 1 % injection 1 mL (has no administration in time range)  ED Course/ Medical Decision Making/ A&P                           Medical Decision Making Amount and/or Complexity of Data Reviewed Labs: ordered.  Risk Prescription drug management.   Patient presents to the ED today with increasing penile discharge, reports having sexual interaction with 2 females who tested positive for chlamydia.  Patient is on another different committed relationship.  He reports worsening discharge noticing this in his boxers, unknown of the color.  GU exam deferred by patient.  No testicular swelling, no testicular pain, no penile swelling, no abdominal pain.  Patient is opting for treatment on today's visit.  UA with bacteria, 0-5 white blood cell count, this was sent for thing of gonorrhea and  chlamydia.  We did discuss risks and benefits of being him for both disease on today's visit.  He would like treatment at this time.   He is agreeable of plan and treatment at this time.  No reported he will comply with 7-day course of doxycycline.  He is otherwise stable for discharge.   Portions of this note were generated with Scientist, clinical (histocompatibility and immunogenetics). Dictation errors may occur despite best attempts at proofreading.  Final Clinical Impression(s) / ED Diagnoses Final diagnoses:  STD exposure    Rx / DC Orders ED Discharge Orders          Ordered    doxycycline (VIBRA-TABS) 100 MG tablet  2 times daily        06/30/21 1231              Claude Manges, PA-C 06/30/21 1234    Mancel Bale, MD 06/30/21 1515

## 2021-06-30 NOTE — Discharge Instructions (Addendum)
You were treated today for gonorrhea with an injection of Rocephin.  In order to be treated for chlamydia.  You will need to complete the entire course of antibiotics.  Please take 1 tablet twice a day for the next 7 days.  Please refrain from sexual intercourse for the next 7 days.  You may also notify your partners.

## 2021-06-30 NOTE — ED Triage Notes (Signed)
Pt presents with request for STD Check, someone he recently slept with was dx. Chlamydia, yeast and BV. No symptoms at present.

## 2022-01-24 NOTE — Telephone Encounter (Signed)
See note dictated

## 2022-03-29 ENCOUNTER — Other Ambulatory Visit: Payer: Self-pay

## 2022-03-29 ENCOUNTER — Emergency Department (HOSPITAL_COMMUNITY)
Admission: EM | Admit: 2022-03-29 | Discharge: 2022-03-30 | Disposition: A | Discharge status: Home / Self Care | Payer: Self-pay | Attending: Emergency Medicine | Admitting: Emergency Medicine

## 2022-03-29 ENCOUNTER — Encounter (HOSPITAL_COMMUNITY): Payer: Self-pay

## 2022-03-29 ENCOUNTER — Emergency Department (HOSPITAL_COMMUNITY): Payer: Self-pay

## 2022-03-29 DIAGNOSIS — M5417 Radiculopathy, lumbosacral region: Secondary | ICD-10-CM | POA: Insufficient documentation

## 2022-03-29 MED ORDER — OXYCODONE-ACETAMINOPHEN 5-325 MG PO TABS
2.0000 | ORAL_TABLET | Freq: Once | ORAL | Status: AC
  Administered 2022-03-30: 2 via ORAL
  Filled 2022-03-29: qty 2

## 2022-03-29 MED ORDER — LIDOCAINE 5 % EX PTCH
1.0000 | MEDICATED_PATCH | CUTANEOUS | Status: DC
  Administered 2022-03-30: 1 via TRANSDERMAL
  Filled 2022-03-29: qty 1

## 2022-03-29 MED ORDER — IBUPROFEN 400 MG PO TABS
600.0000 mg | ORAL_TABLET | Freq: Once | ORAL | Status: DC
  Filled 2022-03-29: qty 1

## 2022-03-29 NOTE — ED Provider Triage Note (Signed)
Emergency Medicine Provider Triage Evaluation Note  Evan Wiley , a 41 y.o. male  was evaluated in triage.  Pt complains of low back pain x 2 days.  It radiates down his lower extremities bilaterally.  Atraumatic, states he was moving furniture around the house but denies lifting anything heavy.  Not reproducible with movement, not having any dysuria or hematuria.  Denies any saddle anesthesia, urinary tension, fecal continence, previous surgeries, malignancies.  Patient thinks he has a bulging disc and should come get imaging.  Review of Systems  Per HPI  Physical Exam  BP (!) 137/97 (BP Location: Right Arm)   Pulse 86   Temp 98.5 F (36.9 C) (Oral)   Resp 16   Ht 5\' 11"  (1.803 m)   Wt 90.7 kg   SpO2 98%   BMI 27.89 kg/m  Gen:   Awake, no distress   Resp:  Normal effort  MSK:   Moves extremities without difficulty  Other:  No midline tenderness, no rash.  Cranial nerves II to XII gross intact right upper and lower extremity strength symmetric.  Positive bilateral straight leg raise.  Medical Decision Making  Medically screening exam initiated at 9:05 PM.  Appropriate orders placed.  Evan Wiley was informed that the remainder of the evaluation will be completed by another provider, this initial triage assessment does not replace that evaluation, and the importance of remaining in the ED until their evaluation is complete.     Wandra Arthurs, PA-C 03/29/22 2106

## 2022-03-29 NOTE — ED Notes (Signed)
Pt verbalized that he was unhappy only 600mg  of Ibuprofen was ordered when he took 800mg  tonight. Pt was informed by this RN as well as , PA that since he took 800mg  at 6pm we can not given him 600mg  nor 800mg  at this time. He was informed a lidocaine patch was ordered and has to come from the pharmacy. I told him as soon as it arrives from the pharmacy I will give it to him. Pt attempting to argue with this RN and [place], Rolly Salter. This RN informed him that the PA was not ordering anything else at this time and he was instructed to return to the ED waiting room until a bed on the main side is ready for him.

## 2022-03-29 NOTE — ED Triage Notes (Signed)
Pt reports mid lower back pain that started 2-3 days ago and is progressively getting worse. Denies injury. Pt ambulatory with independent steady gait.

## 2022-03-30 MED ORDER — OXYCODONE-ACETAMINOPHEN 5-325 MG PO TABS: 1.0000 | ORAL_TABLET | Freq: Three times a day (TID) | ORAL | 0 refills | Status: DC | PRN

## 2022-03-30 MED ORDER — CYCLOBENZAPRINE HCL 10 MG PO TABS: 10.0000 mg | ORAL_TABLET | Freq: Two times a day (BID) | ORAL | 0 refills | Status: DC | PRN

## 2022-03-30 NOTE — ED Provider Notes (Signed)
[facility] EMERGENCY DEPARTMENT Provider Note   CSN: [id number] Arrival date & time: 03/29/22  2048     History  Chief Complaint  Patient presents with   Back Pain    Evan Wiley is a 41 y.o. male.  The history is provided by the patient.  Patient presents with atraumatic low back pain.  He reports over the past 2 days has been gradually worsening.  He reports it starts in his low back and goes to his thighs.  It is improved when he stands up and walks. No previous back surgery.  No leg weakness or numbness.  No urinary or fecal incontinence.  He is otherwise healthy at baseline.  No abdominal pain.     Home Medications Prior to Admission medications   Medication Sig Start Date End Date Taking? Authorizing Provider  cyclobenzaprine (FLEXERIL) 10 MG tablet Take 1 tablet (10 mg total) by mouth 2 (two) times daily as needed for muscle spasms. 03/30/22  Yes Zadie Rhine, MD  oxyCODONE-acetaminophen (PERCOCET/ROXICET) 5-325 MG tablet Take 1 tablet by mouth every 8 (eight) hours as needed for severe pain. 03/30/22  Yes Zadie Rhine, MD  Multiple Vitamin (MULTIVITAMIN WITH MINERALS) TABS tablet Take 1 tablet by mouth daily.    [provider]      Allergies    Bee venom    Review of Systems   Review of Systems  Constitutional:  Negative for fever.  Musculoskeletal:  Positive for back pain.    Physical Exam Updated Vital Signs BP (!) 137/97 (BP Location: Right Arm)   Pulse 86   Temp 98.5 F (36.9 C) (Oral)   Resp 16   Ht 1.803 m (5\' 11" )   Wt 90.7 kg   SpO2 98%   BMI 27.89 kg/m  Physical Exam CONSTITUTIONAL: Well developed/well nourished HEAD: Normocephalic/atraumatic EYES: EOMI/PERRL ENMT: Mucous membranes moist NECK: supple no meningeal signs SPINE/BACK:entire spine nontender  No bruising/crepitance/stepoffs noted to spine ABDOMEN: soft, nontender, no rebound or guarding GU:no cva tenderness NEURO:  Awake/alert,equal motor 5/5 strength noted with the following: hip flexion/knee flexion/extension, foot dorsi/plantar flexion, great toe extension intact bilaterally, no clonus bilaterally, plantar reflex appropriate (toes downgoing), no sensory deficit in any dermatome.  Equal patellar/achilles reflex noted (2+) in bilateral lower extremities.  Pt is able to ambulate unassisted. EXTREMITIES: pulses normal, full ROM SKIN: warm, color normal PSYCH: no abnormalities of mood noted, alert and oriented to situation  ED Results / Procedures / Treatments   Labs (all labs ordered are listed, but only abnormal results are displayed) Labs Reviewed - No data to display  EKG None  Radiology DG Lumbar Spine Complete  Result Date: 03/29/2022 CLINICAL DATA:  Low back pain radiating down lower extremities bilaterally. EXAM: LUMBAR SPINE - COMPLETE 4+ VIEW COMPARISON:  11/11/2012. FINDINGS: There is no evidence of lumbar spine fracture. Alignment is normal. Intervertebral disc spaces are maintained. IMPRESSION: Negative. Electronically Signed   By: 01/12/2013 M.D.   On: 03/29/2022 21:56    Procedures Procedures    Medications Ordered in ED Medications  lidocaine (LIDODERM) 5 % 1 patch (has no administration in time range)  ibuprofen (ADVIL) tablet 600 mg (600 mg Oral Not Given 03/29/22 2118)  oxyCODONE-acetaminophen (PERCOCET/ROXICET) 5-325 MG per tablet 2 tablet (has no administration in time range)    ED Course/ Medical Decision Making/ A&P  Medical Decision Making Risk Prescription drug management.   This patient presents to the ED for concern of back pain, this involves an extensive number of treatment options, and is a complaint that carries with it a high risk of complications and morbidity.  The differential diagnosis includes but is not limited to pyelonephritis, muscle strain, radiculopathy, discitis, epidural abscess  Social Determinants of  Health: Patient's lack of insurance and impaired access to primary care  increases the complexity of managing their presentation  Imaging Studies ordered: I ordered imaging studies including X-ray lumbar spine   I independently visualized and interpreted imaging which showed no acute findings I agree with the radiologist interpretation   Medicines ordered and prescription drug management: I ordered medication including ibuprofen and Percocet for pain   Test Considered: I considered MRI spine, but patient is without any neurodeficits   Complexity of problems addressed: Patient's presentation is most consistent with  acute complicated illness/injury requiring diagnostic workup  Disposition: After consideration of the diagnostic results and the patient's response to treatment,  I feel that the patent would benefit from discharge   .    Patient well-appearing.  He is ambulatory.  No neurodeficits.  No red flags to suggest acute spinal emergency.  Short course of pain meds and muscle relaxers.  Discussed need for outpatient management        Final Clinical Impression(s) / ED Diagnoses Final diagnoses:  Lumbosacral radiculopathy    Rx / DC Orders ED Discharge Orders          Ordered    oxyCODONE-acetaminophen (PERCOCET/ROXICET) 5-325 MG tablet  Every 8 hours PRN        03/30/22 0005    cyclobenzaprine (FLEXERIL) 10 MG tablet  2 times daily PRN        03/30/22 0005              Zadie Rhine, MD 03/30/22 0010

## 2022-03-30 NOTE — Discharge Instructions (Signed)

## 2022-09-22 IMAGING — DX DG ANKLE COMPLETE 3+V*L*
3 series · 3 of 3 positions shown · non-contrast
Comparison: 09/19/2019

CLINICAL DATA: Achilles surgery 1 year ago

EXAM:
LEFT ANKLE COMPLETE - 3+ VIEW

[ankle ap]
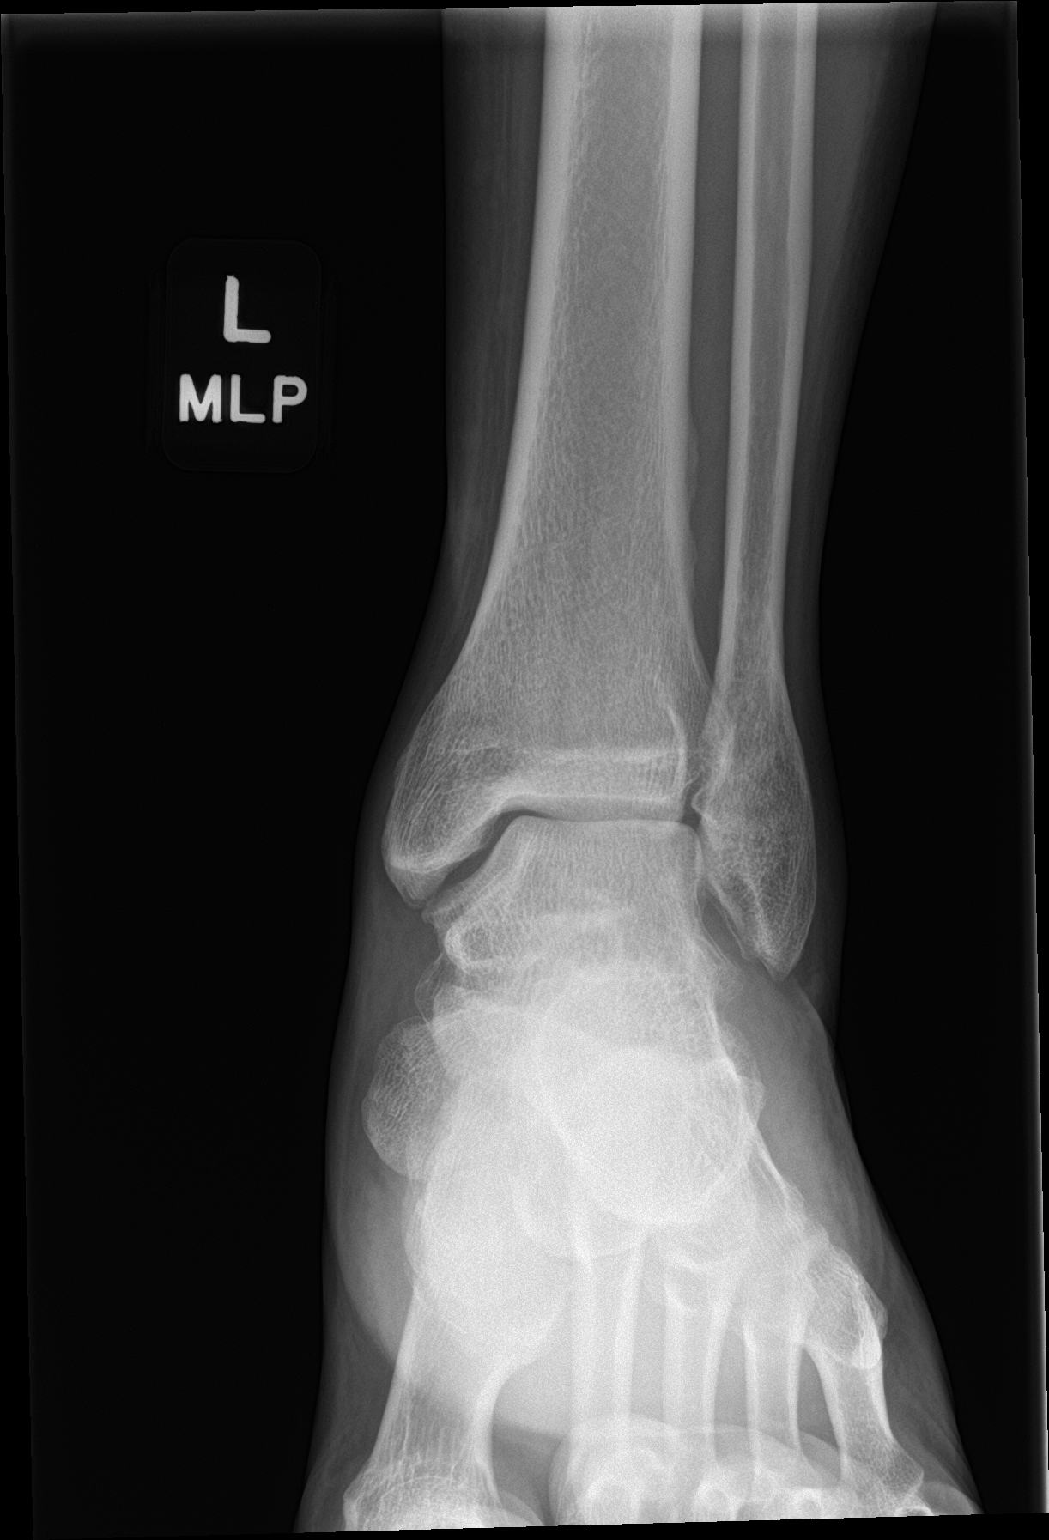

[ankle obl]
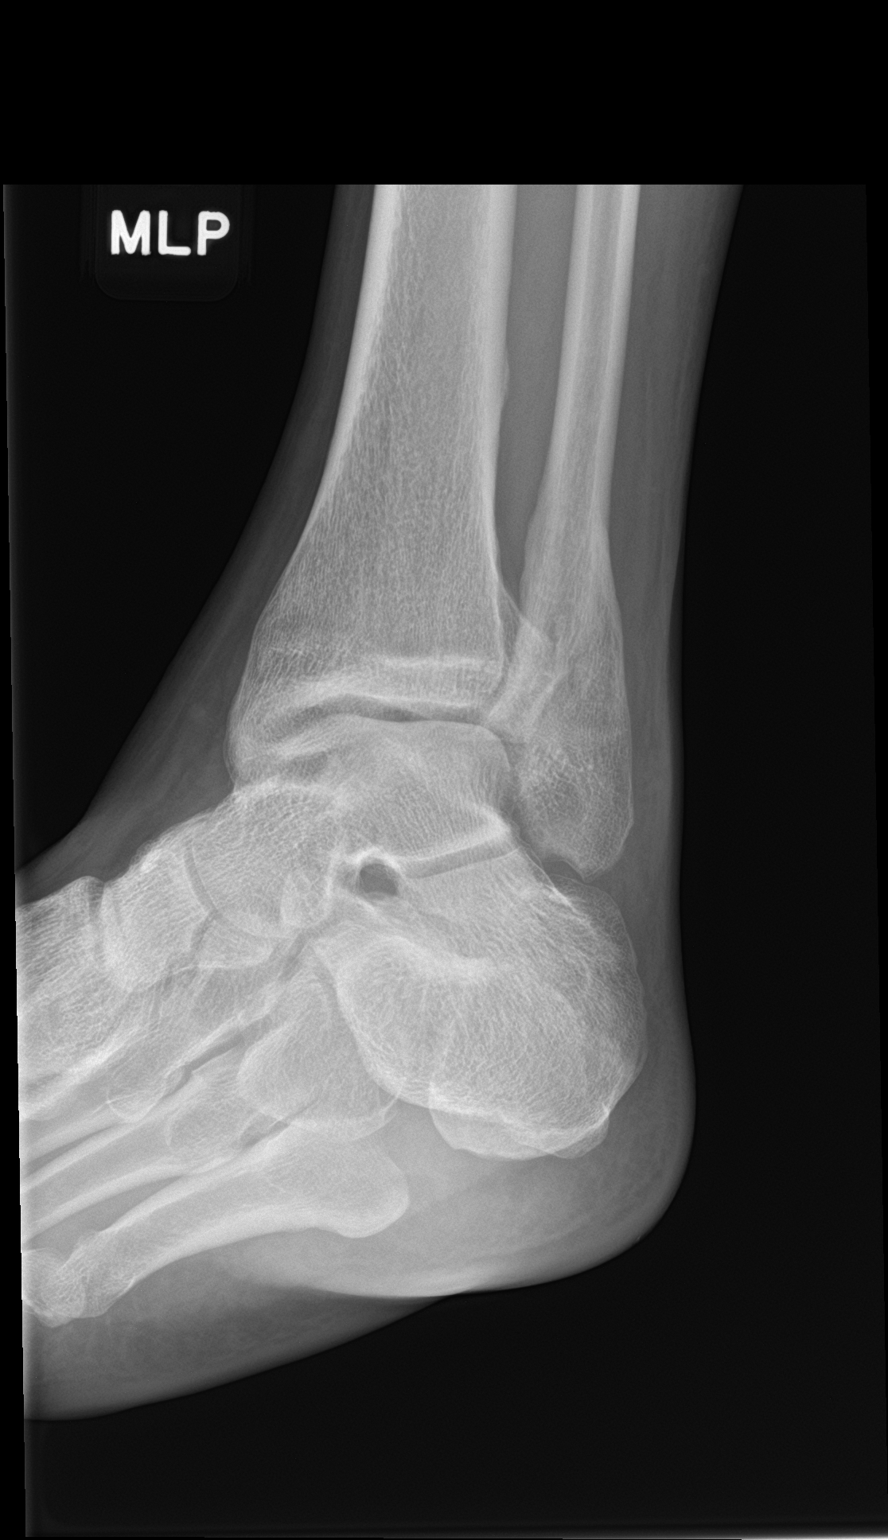

[ankle lat]
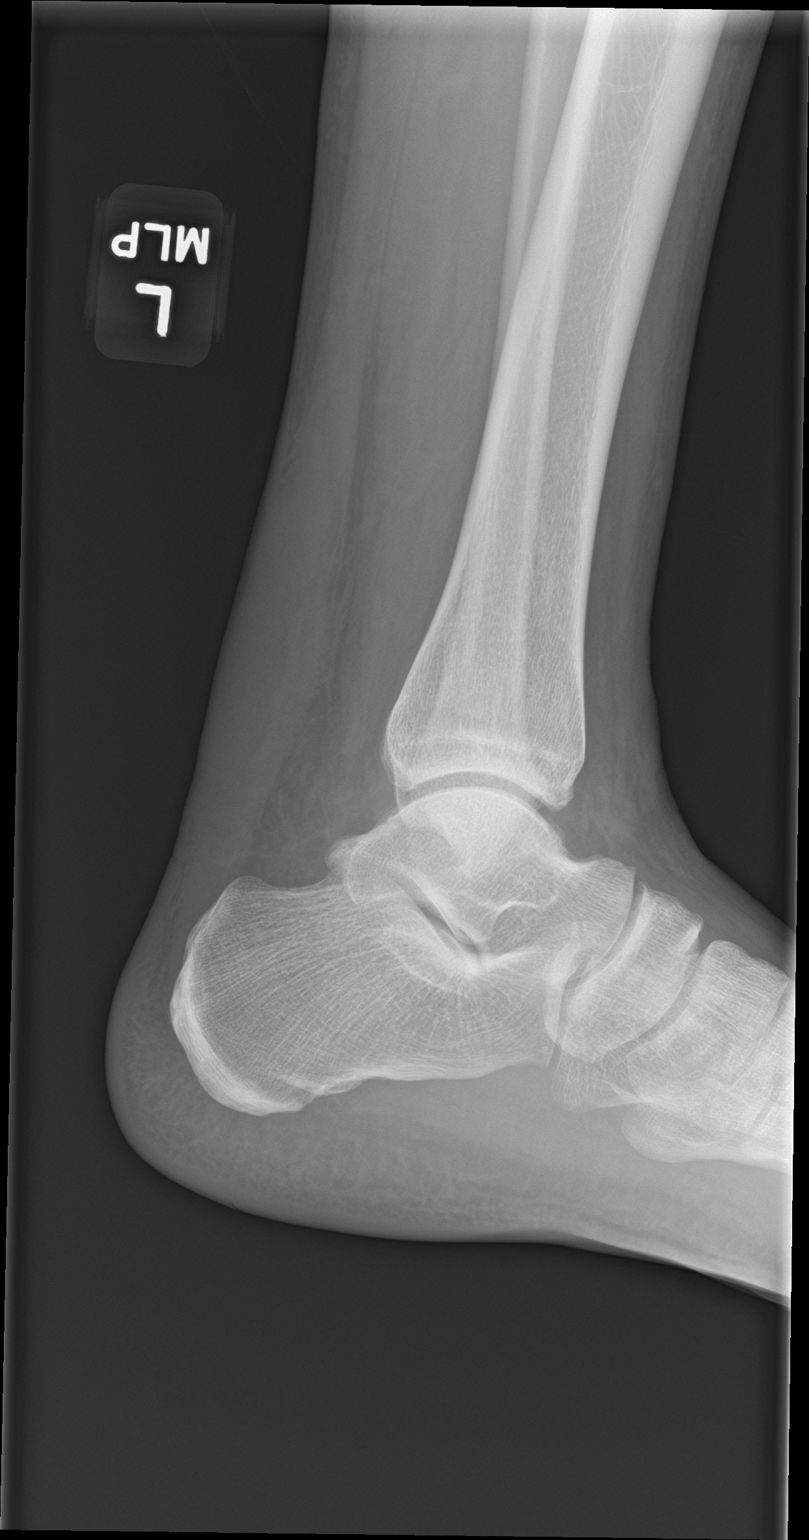

[3 of 3 positions shown; findings below may reference images not displayed]

FINDINGS: There is no evidence of acute fracture, dislocation, or joint
effusion. Probable callused fracture deformity of the distal left
fibula. There is no evidence of arthropathy or other focal bone
abnormality. Soft tissue thickening along the expected course of the
Achilles tendon seen on lateral view, similar to prior examination.
IMPRESSION: 1. Soft tissue thickening along the expected course of the Achilles
tendon seen on lateral view, similar to prior examination. Findings
may reflect edema and/or tendinopathy and may be further evaluated
by MR if desired.
2. No acute fracture or dislocation. Probable callused fracture
deformity of the distal left fibula.

## 2022-11-07 ENCOUNTER — Emergency Department (HOSPITAL_COMMUNITY)
Admission: EM | Admit: 2022-11-07 | Discharge: 2022-11-07 | Disposition: A | Discharge status: Home / Self Care | Payer: 59 | Attending: Emergency Medicine | Admitting: Emergency Medicine

## 2022-11-07 ENCOUNTER — Encounter (HOSPITAL_COMMUNITY): Payer: Self-pay

## 2022-11-07 ENCOUNTER — Emergency Department (HOSPITAL_COMMUNITY): Payer: 59

## 2022-11-07 ENCOUNTER — Other Ambulatory Visit: Payer: Self-pay

## 2022-11-07 DIAGNOSIS — R0689 Other abnormalities of breathing: Secondary | ICD-10-CM | POA: Diagnosis not present

## 2022-11-07 DIAGNOSIS — R0789 Other chest pain: Secondary | ICD-10-CM | POA: Diagnosis not present

## 2022-11-07 DIAGNOSIS — K219 Gastro-esophageal reflux disease without esophagitis: Secondary | ICD-10-CM | POA: Diagnosis not present

## 2022-11-07 DIAGNOSIS — R079 Chest pain, unspecified: Secondary | ICD-10-CM | POA: Diagnosis not present

## 2022-11-07 DIAGNOSIS — R12 Heartburn: Secondary | ICD-10-CM | POA: Diagnosis not present

## 2022-11-07 LAB — CBC
HCT: 42.8 % (ref 39.0–52.0)
Hemoglobin: 15.1 g/dL (ref 13.0–17.0)
MCH: 30.4 pg (ref 26.0–34.0)
MCHC: 35.3 g/dL (ref 30.0–36.0)
MCV: 86.1 fL (ref 80.0–100.0)
Platelets: 286 10*3/uL (ref 150–400)
RBC: 4.97 MIL/uL (ref 4.22–5.81)
RDW: 14 % (ref 11.5–15.5)
WBC: 6.5 10*3/uL (ref 4.0–10.5)
nRBC: 0 % (ref 0.0–0.2)

## 2022-11-07 LAB — BASIC METABOLIC PANEL
Anion gap: 7 (ref 5–15)
BUN: 10 mg/dL (ref 6–20)
CO2: 23 mmol/L (ref 22–32)
Calcium: 9.1 mg/dL (ref 8.9–10.3)
Chloride: 103 mmol/L (ref 98–111)
Creatinine, Ser: 0.82 mg/dL (ref 0.61–1.24)
GFR, Estimated: >60 mL/min (ref >60)
Glucose, Bld: 98 mg/dL (ref 70–99)
Potassium: 3.6 mmol/L (ref 3.5–5.1)
Sodium: 133 mmol/L — ABNORMAL LOW (ref 135–145)

## 2022-11-07 LAB — TROPONIN I (HIGH SENSITIVITY): Troponin I (High Sensitivity): 4 ng/L (ref <18)

## 2022-11-07 MED ORDER — SUCRALFATE 1 G PO TABS: 1.0000 g | ORAL_TABLET | Freq: Three times a day (TID) | ORAL | 0 refills | Status: DC

## 2022-11-07 MED ORDER — PANTOPRAZOLE SODIUM 40 MG IV SOLR
40.0000 mg | Freq: Once | INTRAVENOUS | Status: AC
  Administered 2022-11-07: 40 mg via INTRAVENOUS
  Filled 2022-11-07: qty 10

## 2022-11-07 MED ORDER — IOHEXOL 350 MG/ML SOLN
75.0000 mL | Freq: Once | INTRAVENOUS | Status: AC | PRN
  Administered 2022-11-07: 75 mL via INTRAVENOUS

## 2022-11-07 MED ORDER — PANTOPRAZOLE SODIUM 20 MG PO TBEC: 20.0000 mg | DELAYED_RELEASE_TABLET | Freq: Two times a day (BID) | ORAL | 0 refills | Status: DC

## 2022-11-07 MED ORDER — MORPHINE SULFATE (PF) 4 MG/ML IV SOLN
4.0000 mg | Freq: Once | INTRAVENOUS | Status: AC
  Administered 2022-11-07: 4 mg via INTRAVENOUS
  Filled 2022-11-07: qty 1

## 2022-11-07 NOTE — ED Triage Notes (Signed)
Right leg pain, heartburn, and pt states he feels like he has a lump in his throat for 3 days. States he had difficulty breathing last night when he was laying down.

## 2022-11-07 NOTE — Discharge Instructions (Addendum)
Please review the diet instructions.  Avoid eating at night 40 go to bed.  Take the antacid medications as prescribed.  Follow-up with the GI doctor for further evaluation

## 2022-11-07 NOTE — ED Provider Notes (Signed)
[place] EMERGENCY DEPARTMENT AT [facility] Provider Note   CSN: [id number] Arrival date & time: 11/07/22  1742     History  Chief Complaint  Patient presents with   Heartburn   Leg Pain   Dysphagia    Evan Wiley is a 42 y.o. male.   Heartburn  Leg Pain    Patient presents ED for evaluation of sharp pain in his chest that radiates to his back.  Patient also states he feels like he has a lump in his throat.  His symptoms get worse when he is lying back.  Patient felt like he was having some trouble with his breathing last night.  Patient denies any history of heart disease.  He does smoke.  His mom does have a history of some type of esophageal issue.  Patient is concerned he might have that issue as well.  Patient does not have any history of PE or DVT.  Home Medications Prior to Admission medications   Medication Sig Start Date End Date Taking? Authorizing Provider  pantoprazole (PROTONIX) 20 MG tablet Take 1 tablet (20 mg total) by mouth 2 (two) times daily for 20 days. 11/07/22 11/27/22 Yes Linwood Dibbles, MD  sucralfate (CARAFATE) 1 g tablet Take 1 tablet (1 g total) by mouth 4 (four) times daily -  with meals and at bedtime for 10 days. 11/07/22 11/17/22 Yes Linwood Dibbles, MD  cyclobenzaprine (FLEXERIL) 10 MG tablet Take 1 tablet (10 mg total) by mouth 2 (two) times daily as needed for muscle spasms. 03/30/22   Zadie Rhine, MD  Multiple Vitamin (MULTIVITAMIN WITH MINERALS) TABS tablet Take 1 tablet by mouth daily.    [provider]  oxyCODONE-acetaminophen (PERCOCET/ROXICET) 5-325 MG tablet Take 1 tablet by mouth every 8 (eight) hours as needed for severe pain. 03/30/22   Zadie Rhine, MD      Allergies    Bee venom    Review of Systems   Review of Systems  Gastrointestinal:  Positive for heartburn.    Physical Exam Updated Vital Signs BP 137/85   Pulse 69   Temp 98.5 F (36.9 C) (Oral)   Resp 18   Ht 1.803 m (5\' 11" )   Wt 83.9  kg   SpO2 99%   BMI 25.80 kg/m  Physical Exam Vitals and nursing note reviewed.  Constitutional:      Appearance: He is well-developed. He is ill-appearing.  HENT:     Head: Normocephalic and atraumatic.     Right Ear: External ear normal.     Left Ear: External ear normal.  Eyes:     General: No scleral icterus.       Right eye: No discharge.        Left eye: No discharge.     Conjunctiva/sclera: Conjunctivae normal.  Neck:     Trachea: No tracheal deviation.  Cardiovascular:     Rate and Rhythm: Normal rate and regular rhythm.  Pulmonary:     Effort: Pulmonary effort is normal. No respiratory distress.     Breath sounds: Normal breath sounds. No stridor. No wheezing or rales.  Abdominal:     General: Bowel sounds are normal. There is no distension.     Palpations: Abdomen is soft.     Tenderness: There is no abdominal tenderness. There is no guarding or rebound.  Musculoskeletal:        General: No tenderness or deformity.     Cervical back: Neck supple.  Right lower leg: No edema.     Left lower leg: No edema.  Skin:    General: Skin is warm and dry.     Findings: No rash.  Neurological:     General: No focal deficit present.     Mental Status: He is alert.     Cranial Nerves: No cranial nerve deficit, dysarthria or facial asymmetry.     Sensory: No sensory deficit.     Motor: No abnormal muscle tone or seizure activity.     Coordination: Coordination normal.  Psychiatric:        Mood and Affect: Mood normal.     ED Results / Procedures / Treatments   Labs (all labs ordered are listed, but only abnormal results are displayed) Labs Reviewed  BASIC METABOLIC PANEL - Abnormal; Notable for the following components:      Result Value   Sodium 133 (*)    All other components within normal limits  CBC  TROPONIN I (HIGH SENSITIVITY)    EKG EKG Interpretation Date/Time:  Tuesday November 07 2022 17:59:15 EDT Ventricular Rate:  77 PR Interval:  160 QRS  Duration:  91 QT Interval:  373 QTC Calculation: 423 R Axis:   66  Text Interpretation: Sinus rhythm Consider left ventricular hypertrophy ST elev, probable normal early repol pattern No significant change since last tracing Confirmed by Linwood Dibbles [phone]) on 11/07/2022 6:48:14 PM  Radiology CT Angio Chest Aorta W and/or Wo Contrast  Result Date: 11/07/2022 CLINICAL DATA:  Chest pain and difficulty breathing, initial encounter EXAM: CT ANGIOGRAPHY CHEST WITH CONTRAST TECHNIQUE: Multidetector CT imaging of the chest was performed using the standard protocol during bolus administration of intravenous contrast. Multiplanar CT image reconstructions and MIPs were obtained to evaluate the vascular anatomy. RADIATION DOSE REDUCTION: This exam was performed according to the departmental dose-optimization program which includes automated exposure control, adjustment of the mA and/or kV according to patient size and/or use of iterative reconstruction technique. CONTRAST:  75mL OMNIPAQUE IOHEXOL 350 MG/ML SOLN COMPARISON:  None Available. FINDINGS: Cardiovascular: Thoracic aorta is well visualized without aneurysmal dilatation. The left vertebral artery arises directly from the arch. No dissection is seen. Pulmonary artery as visualized shows no filling defect to suggest pulmonary embolism. No coronary calcifications are noted. Mediastinum/Nodes: Thoracic inlet is within normal limits. No hilar or mediastinal adenopathy is noted. The esophagus as visualized demonstrates some circumferential thickening distally likely related to reflux disease. Lungs/Pleura: Lungs are well aerated bilaterally. No focal infiltrate or effusion is seen. Upper Abdomen: Visualized upper abdomen appears within normal limits. Musculoskeletal: No chest wall abnormality. No acute or significant osseous findings. Review of the MIP images confirms the above findings. IMPRESSION: No evidence of pulmonary emboli. No aortic dissection or aneurysmal  dilatation. Distal esophageal thickening is noted likely related underlying reflux. Electronically Signed   By: Alcide Clever M.D.   On: 11/07/2022 20:08   DG Chest 2 View  Result Date: 11/07/2022 CLINICAL DATA:  Chest pain, heartburn EXAM: CHEST - 2 VIEW COMPARISON:  10/18/2016 FINDINGS: The heart size and mediastinal contours are within normal limits. Both lungs are clear. The visualized skeletal structures are unremarkable. IMPRESSION: No active cardiopulmonary disease. Electronically Signed   By: Ernie Avena M.D.   On: 11/07/2022 18:18    Procedures Procedures    Medications Ordered in ED Medications  pantoprazole (PROTONIX) injection 40 mg (40 mg Intravenous Given 11/07/22 1915)  morphine (PF) 4 MG/ML injection 4 mg (4 mg Intravenous Given 11/07/22 1915)  iohexol (OMNIPAQUE) 350 MG/ML injection 75 mL (75 mLs Intravenous Contrast Given 11/07/22 1950)    ED Course/ Medical Decision Making/ A&P Clinical Course as of 11/07/22 2128  Tue Nov 07, 2022  1847 X-ray without acute findings [JK]  1925 CBC normal.  Metabolic panel normal.  Troponin normal [JK]  2011 CT scan does not show evidence of dissection.  Findings do suggest esophageal inflammation [JK]    Clinical Course User Index [JK] Linwood Dibbles, MD             HEART Score: 1                Medical Decision Making Differential diagnosis includes but not limited to pneumonia, aortic dissection, acute coronary syndrome, reflux  Problems Addressed: Gastroesophageal reflux disease, unspecified whether esophagitis present: acute illness or injury that poses a threat to life or bodily functions  Amount and/or Complexity of Data Reviewed Labs: ordered. Decision-making details documented in ED Course. Radiology: ordered and independent interpretation performed.  Risk Prescription drug management.   Resented to the ED for evaluation of chest pain.  ED workup reassuring.  CBC normal.  Metabolic panel unremarkable.  EKG and  troponin are reassuring.  Was concerned about his description of the pain rating to his back so CT angiogram was performed.  Does not show any evidence of aortic dissection or pulmonary emboli.  Patient does have findings of esophagitis which is consistent with his presentation.  Patient was given dose of antacids.  Will discharge home with Protonix and Carafate.  Will have him follow-up with GI for further outpatient evaluation        Final Clinical Impression(s) / ED Diagnoses Final diagnoses:  Gastroesophageal reflux disease, unspecified whether esophagitis present    Rx / DC Orders ED Discharge Orders          Ordered    pantoprazole (PROTONIX) 20 MG tablet  2 times daily        11/07/22 2122    sucralfate (CARAFATE) 1 g tablet  3 times daily with meals & bedtime        11/07/22 2122              Linwood Dibbles, MD 11/07/22 2128

## 2022-11-09 ENCOUNTER — Other Ambulatory Visit: Payer: Self-pay

## 2022-11-09 ENCOUNTER — Ambulatory Visit (HOSPITAL_COMMUNITY)
Admission: EM | Admit: 2022-11-09 | Discharge: 2022-11-09 | Disposition: A | Discharge status: Home / Self Care | Payer: 59 | Attending: Emergency Medicine | Admitting: Emergency Medicine

## 2022-11-09 ENCOUNTER — Encounter (HOSPITAL_COMMUNITY): Payer: Self-pay | Admitting: Emergency Medicine

## 2022-11-09 DIAGNOSIS — M545 Low back pain, unspecified: Secondary | ICD-10-CM

## 2022-11-09 MED ORDER — CYCLOBENZAPRINE HCL 10 MG PO TABS: 10.0000 mg | ORAL_TABLET | Freq: Three times a day (TID) | ORAL | 0 refills | Status: DC

## 2022-11-09 MED ORDER — LIDOCAINE 5 % EX PTCH: 1.0000 | MEDICATED_PATCH | CUTANEOUS | 0 refills | Status: DC

## 2022-11-09 MED ORDER — IBUPROFEN 800 MG PO TABS: 800.0000 mg | ORAL_TABLET | Freq: Three times a day (TID) | ORAL | 0 refills | Status: DC

## 2022-11-09 MED ORDER — IBUPROFEN 800 MG PO TABS
ORAL_TABLET | ORAL | Status: AC
  Filled 2022-11-09: qty 1

## 2022-11-09 MED ORDER — IBUPROFEN 800 MG PO TABS
800.0000 mg | ORAL_TABLET | Freq: Once | ORAL | Status: AC
  Administered 2022-11-09: 800 mg via ORAL

## 2022-11-09 NOTE — ED Provider Notes (Signed)
[facility]    CSN: [id number] Arrival date & time: 11/09/22  1048     History   Chief Complaint Chief Complaint  Patient presents with   Back Pain    HPI Evan Wiley is a 42 y.o. male.  Here with 1 week history of back pain flare up Located low back. Sometimes feels pain into the thighs No numbness or tingling, no weakness No bladder or bowel dysfunction. Not having fever or chills.  Has been bothering him for a while (several months to years) but acutely worsened this week. No recent injury, trauma, falls. He goes to the gym and does heavy lifting. 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  His reflux symptoms have resolved with the medicines provided.  Past Medical History:  Diagnosis Date   Asthma    no inhaler   Bipolar disorder (HCC)    no meds   Heart murmur    never has caused any problems, dx as a child   Reactive airway disease    no inhaler   Schizophrenia (HCC)    no meds    Patient Active Problem List   Diagnosis Date Noted   Achilles tendon rupture, left, initial encounter 09/24/2019   Complete rupture of left Achilles tendon 09/24/2019    Past Surgical History:  Procedure Laterality Date   ACHILLES TENDON SURGERY Left 09/24/2019   Procedure: LEFT ACHILLES TENDON REPAIR;  Surgeon: Eldred Manges, MD;  Location: [facility] OR;  Service: Orthopedics;  Laterality: Left;   FACIAL COSMETIC SURGERY     FRACTURE SURGERY     WISDOM TOOTH EXTRACTION         Home Medications    Prior to Admission medications   Medication Sig Start Date End Date Taking? Authorizing Provider  cyclobenzaprine (FLEXERIL) 10 MG tablet Take 1 tablet (10 mg total) by mouth 3 (three) times daily. 11/09/22   [place] Huberty, Lurena Joiner, PA-C  ibuprofen (ADVIL) 800 MG tablet Take 1 tablet (800 mg total) by mouth 3 (three) times daily.  11/09/22   Tawfiq Favila, PA-C  lidocaine (LIDODERM) 5 % Place 1 patch onto the skin daily. Remove & Discard patch within 12 hours 11/09/22   Glennys Schorsch, Lurena Joiner, PA-C  Multiple Vitamin (MULTIVITAMIN WITH MINERALS) TABS tablet Take 1 tablet by mouth daily.    [provider]  pantoprazole (PROTONIX) 20 MG tablet Take 1 tablet (20 mg total) by mouth 2 (two) times daily for 20 days. 11/07/22 11/27/22  Linwood Dibbles, MD  sucralfate (CARAFATE) 1 g tablet Take 1 tablet (1 g total) by mouth 4 (four) times daily -  with meals and at bedtime for 10 days. 11/07/22 11/17/22  Linwood Dibbles, MD    Family History Family History  Problem Relation Age of Onset   Hypertension Mother    Diabetes Mother    Diabetes Father    Hypertension Father     Social History Social History   Tobacco Use   Smoking status: Some Days    Packs/day: 0.50    Years: 27.00    Additional pack years: 0.00    Total pack years: 13.50    Types: Cigarettes   Smokeless tobacco: Never  Vaping Use   Vaping Use: Never used  Substance Use Topics   Alcohol use: Yes   Drug use:  Yes    Types: Marijuana     Allergies   Bee venom   Review of Systems Review of Systems  Musculoskeletal:  Positive for back pain.   As per HPI  Physical Exam Triage Vital Signs ED Triage Vitals  Enc Vitals Group     BP 11/09/22 1117 118/74     Pulse Rate 11/09/22 1117 78     Resp 11/09/22 1117 20     Temp 11/09/22 1117 98.1 F (36.7 C)     Temp Source 11/09/22 1117 Oral     SpO2 11/09/22 1117 95 %     Weight --      Height --      Head Circumference --      Peak Flow --      Pain Score 11/09/22 1115 10     Pain Loc --      Pain Edu? --      Excl. in GC? --    No data found.  Updated Vital Signs BP 118/74 (BP Location: Left Arm)   Pulse 78   Temp 98.1 F (36.7 C) (Oral)   Resp 20   SpO2 95%    Physical Exam Vitals and nursing note reviewed.  Constitutional:      General: He is not in acute distress. HENT:      Mouth/Throat:     Mouth: Mucous membranes are moist.     Pharynx: Oropharynx is clear.  Eyes:     Extraocular Movements: Extraocular movements intact.     Conjunctiva/sclera: Conjunctivae normal.     Pupils: Pupils are equal, round, and reactive to light.  Cardiovascular:     Rate and Rhythm: Normal rate and regular rhythm.     Heart sounds: Normal heart sounds.  Pulmonary:     Effort: Pulmonary effort is normal.     Breath sounds: Normal breath sounds.  Musculoskeletal:        General: Normal range of motion.     Cervical back: Normal range of motion. No rigidity or tenderness.     Comments: No bony tenderness C-L spine. Lumbar paraspinals are tight. No bruising or ecchymosis. Full ROM all extremities   Skin:    General: Skin is warm and dry.  Neurological:     General: No focal deficit present.     Mental Status: He is alert and oriented to person, place, and time.     Cranial Nerves: Cranial nerves 2-12 are intact. No cranial nerve deficit.     Sensory: Sensation is intact.     Motor: Motor function is intact. No weakness.     Coordination: Coordination is intact.     Gait: Gait is intact.     Deep Tendon Reflexes: Reflexes are normal and symmetric.     Comments: Strength 5/5. Sensation intact throughout      UC Treatments / Results  Labs (all labs ordered are listed, but only abnormal results are displayed) Labs Reviewed - No data to display  EKG  Radiology CT Angio Chest Aorta W and/or Wo Contrast  Result Date: 11/07/2022 CLINICAL DATA:  Chest pain and difficulty breathing, initial encounter EXAM: CT ANGIOGRAPHY CHEST WITH CONTRAST TECHNIQUE: Multidetector CT imaging of the chest was performed using the standard protocol during bolus administration of intravenous contrast. Multiplanar CT image reconstructions and MIPs were obtained to evaluate the vascular anatomy. RADIATION DOSE REDUCTION: This exam was performed according to the departmental dose-optimization program  which includes automated exposure control, adjustment of the mA  and/or kV according to patient size and/or use of iterative reconstruction technique. CONTRAST:  75mL OMNIPAQUE IOHEXOL 350 MG/ML SOLN COMPARISON:  None Available. FINDINGS: Cardiovascular: Thoracic aorta is well visualized without aneurysmal dilatation. The left vertebral artery arises directly from the arch. No dissection is seen. Pulmonary artery as visualized shows no filling defect to suggest pulmonary embolism. No coronary calcifications are noted. Mediastinum/Nodes: Thoracic inlet is within normal limits. No hilar or mediastinal adenopathy is noted. The esophagus as visualized demonstrates some circumferential thickening distally likely related to reflux disease. Lungs/Pleura: Lungs are well aerated bilaterally. No focal infiltrate or effusion is seen. Upper Abdomen: Visualized upper abdomen appears within normal limits. Musculoskeletal: No chest wall abnormality. No acute or significant osseous findings. Review of the MIP images confirms the above findings. IMPRESSION: No evidence of pulmonary emboli. No aortic dissection or aneurysmal dilatation. Distal esophageal thickening is noted likely related underlying reflux. Electronically Signed   By: Alcide Clever M.D.   On: 11/07/2022 20:08   DG Chest 2 View  Result Date: 11/07/2022 CLINICAL DATA:  Chest pain, heartburn EXAM: CHEST - 2 VIEW COMPARISON:  10/18/2016 FINDINGS: The heart size and mediastinal contours are within normal limits. Both lungs are clear. The visualized skeletal structures are unremarkable. IMPRESSION: No active cardiopulmonary disease. Electronically Signed   By: Ernie Avena M.D.   On: 11/07/2022 18:18    Procedures Procedures   Medications Ordered in UC Medications  ibuprofen (ADVIL) tablet 800 mg (800 mg Oral Given 11/09/22 1215)    Initial Impression / Assessment and Plan / UC Course  I have reviewed the triage vital signs and the nursing  notes.  Pertinent labs & imaging results that were available during my care of the patient were reviewed by me and considered in my medical decision making (see chart for details).  No red flags, neuro exam intact. VSS. Declines toradol or steroid injection in clinic. Provided with oral 800 mg ibuprofen instead. Patient requested narcotics several times. He was given morphine in the ED 2 days ago and had Percocet prescription in November from ED for back pain.  Discussed urgent care does not manage chronic pain and narcotics are not warranted for back pain. Provided with pain clinic info. Advised he follow with orthopedics as well for further evaluation. In the meantime ibuprofen 800 mg q6 hours, flexeril TID prn, lidocaine patch, avoiding heavy lifting and strenuous activity. All question answered, patient agreeable to plan  Final Clinical Impressions(s) / UC Diagnoses   Final diagnoses:  Acute bilateral low back pain without sciatica     Discharge Instructions      Scan the QR code on the last page to get set up with a primary care provider  Call the spine/pain clinic to make appointment.  Please see the orthopedic specialists. You can call to make appointment or go to their walk-in hours.   You can take the muscle relaxer three times daily. If the medication makes you drowsy, take only one at bed time. Ibuprofen and/or tylenol every 6 hours Lidocaine patch can be applied for 12 hours at a time Avoid heavy lifting and strenuous activity     ED Prescriptions     Medication Sig Dispense Auth. Provider   ibuprofen (ADVIL) 800 MG tablet  (Status: Discontinued) Take 1 tablet (800 mg total) by mouth 3 (three) times daily. 21 tablet Cloma Rahrig, PA-C   cyclobenzaprine (FLEXERIL) 10 MG tablet  (Status: Discontinued) Take 1 tablet (10 mg total) by mouth 3 (three) times  daily. 30 tablet Valentin Benney, PA-C   lidocaine (LIDODERM) 5 %  (Status: Discontinued) Place 1 patch onto the  skin daily. Remove & Discard patch within 12 hours 14 patch Maheen Cwikla, PA-C   cyclobenzaprine (FLEXERIL) 10 MG tablet Take 1 tablet (10 mg total) by mouth 3 (three) times daily. 30 tablet Broghan Pannone, PA-C   ibuprofen (ADVIL) 800 MG tablet Take 1 tablet (800 mg total) by mouth 3 (three) times daily. 21 tablet Raiyah Speakman, PA-C   lidocaine (LIDODERM) 5 % Place 1 patch onto the skin daily. Remove & Discard patch within 12 hours 14 patch Bertrand Vowels, PA-C      I have reviewed the PDMP during this encounter.   Kathrine Haddock 11/09/22 1336

## 2022-11-09 NOTE — Discharge Instructions (Addendum)
Scan the QR code on the last page to get set up with a primary care provider  Call the spine/pain clinic to make appointment.  Please see the orthopedic specialists. You can call to make appointment or go to their walk-in hours.   You can take the muscle relaxer three times daily. If the medication makes you drowsy, take only one at bed time. Ibuprofen and/or tylenol every 6 hours Lidocaine patch can be applied for 12 hours at a time Avoid heavy lifting and strenuous activity

## 2022-11-09 NOTE — ED Triage Notes (Signed)
Patient having back pain.  This episode started 1-2 weeks ago.   Was seen in ED and evaluated.  Part of instructions was to follow up with provider, but did not do this and reports he had to leave work yesterday due to back pain.  Pain in lower back.  Reports pain in right leg  Patient was seen in ED  States he was to follow up with pcp, says he was not given any scripts.  States several times he was on morphine in hospital.  And states on percocet in the past.

## 2022-11-10 ENCOUNTER — Encounter (HOSPITAL_COMMUNITY): Payer: Self-pay

## 2022-11-10 ENCOUNTER — Emergency Department (HOSPITAL_COMMUNITY)
Admission: EM | Admit: 2022-11-10 | Discharge: 2022-11-10 | Disposition: A | Discharge status: Home / Self Care | Payer: 59 | Attending: Emergency Medicine | Admitting: Emergency Medicine

## 2022-11-10 ENCOUNTER — Other Ambulatory Visit: Payer: Self-pay

## 2022-11-10 DIAGNOSIS — M545 Low back pain, unspecified: Secondary | ICD-10-CM | POA: Insufficient documentation

## 2022-11-10 DIAGNOSIS — M549 Dorsalgia, unspecified: Secondary | ICD-10-CM | POA: Diagnosis not present

## 2022-11-10 DIAGNOSIS — G8929 Other chronic pain: Secondary | ICD-10-CM | POA: Diagnosis not present

## 2022-11-10 DIAGNOSIS — M5459 Other low back pain: Secondary | ICD-10-CM | POA: Diagnosis not present

## 2022-11-10 MED ORDER — OXYCODONE HCL 5 MG PO TABS
5.0000 mg | ORAL_TABLET | Freq: Once | ORAL | Status: AC
  Administered 2022-11-10: 5 mg via ORAL
  Filled 2022-11-10: qty 1

## 2022-11-10 MED ORDER — LIDOCAINE 5 % EX PTCH
1.0000 | MEDICATED_PATCH | CUTANEOUS | Status: DC
  Administered 2022-11-10: 1 via TRANSDERMAL
  Filled 2022-11-10: qty 1

## 2022-11-10 MED ORDER — KETOROLAC TROMETHAMINE 15 MG/ML IJ SOLN
15.0000 mg | Freq: Once | INTRAMUSCULAR | Status: AC
  Administered 2022-11-10: 15 mg via INTRAVENOUS
  Filled 2022-11-10: qty 1

## 2022-11-10 MED ORDER — ACETAMINOPHEN 500 MG PO TABS
1000.0000 mg | ORAL_TABLET | Freq: Once | ORAL | Status: AC
  Administered 2022-11-10: 1000 mg via ORAL
  Filled 2022-11-10: qty 2

## 2022-11-10 NOTE — ED Provider Notes (Signed)
[place] EMERGENCY DEPARTMENT AT [facility] Provider Note   CSN: [id number] Arrival date & time: 11/10/22  6962     History  Chief Complaint  Patient presents with   Back Pain    Antone Summons is a 42 y.o. male with PMHx chronic back pain, bipolar disorder, schizophrenia who presents to ED concerned for back pain flare. Denies recent trauma. States that he was prescribed Flexuril and Percocet when back pain first began. With stretching and rest, back pain was tolerable until 1.5 weeks ago. Works at [employer] job which he thinks may be exacerbating back pain -  recently invested in standing desk. Called spine place yesterday and hasn't heard back. In ED today because pain is not tolerable anymore despite Tylenol/Ibuprofen.  Patient denies urinary retention, fecal incontinence, saddle anesthesia, lower extremity weakness, hx of cancer, fever, immunosuppression, IVDU, spinal procedure, significant trauma   Back Pain      Home Medications Prior to Admission medications   Medication Sig Start Date End Date Taking? Authorizing Provider  cyclobenzaprine (FLEXERIL) 10 MG tablet Take 1 tablet (10 mg total) by mouth 3 (three) times daily. 11/09/22   Rising, Lurena Joiner, PA-C  ibuprofen (ADVIL) 800 MG tablet Take 1 tablet (800 mg total) by mouth 3 (three) times daily. 11/09/22   Rising, Rebecca, PA-C  lidocaine (LIDODERM) 5 % Place 1 patch onto the skin daily. Remove & Discard patch within 12 hours 11/09/22   Rising, Lurena Joiner, PA-C  Multiple Vitamin (MULTIVITAMIN WITH MINERALS) TABS tablet Take 1 tablet by mouth daily.    [provider]  pantoprazole (PROTONIX) 20 MG tablet Take 1 tablet (20 mg total) by mouth 2 (two) times daily for 20 days. 11/07/22 11/27/22  Linwood Dibbles, MD  sucralfate (CARAFATE) 1 g tablet Take 1 tablet (1 g total) by mouth 4 (four) times daily -  with meals and at bedtime for 10 days. 11/07/22 11/17/22  Linwood Dibbles, MD      Allergies    Bee venom    Review  of Systems   Review of Systems  Musculoskeletal:  Positive for back pain.    Physical Exam Updated Vital Signs BP 128/85   Pulse 88   Temp 98.7 F (37.1 C) (Oral)   Resp 18   Ht 5\' 11"  (1.803 m)   Wt 83.9 kg   SpO2 98%   BMI 25.80 kg/m  Physical Exam Vitals and nursing note reviewed.  Constitutional:      General: He is not in acute distress.    Appearance: He is not ill-appearing or toxic-appearing.  HENT:     Head: Normocephalic and atraumatic.     Mouth/Throat:     Mouth: Mucous membranes are moist.  Eyes:     General: No scleral icterus.       Right eye: No discharge.        Left eye: No discharge.     Conjunctiva/sclera: Conjunctivae normal.  Cardiovascular:     Rate and Rhythm: Normal rate and regular rhythm.     Pulses: Normal pulses.     Heart sounds: Normal heart sounds. No murmur heard. Pulmonary:     Effort: Pulmonary effort is normal.  Abdominal:     General: Abdomen is flat. Bowel sounds are normal.     Palpations: Abdomen is soft.     Tenderness: There is no abdominal tenderness.  Musculoskeletal:     Right lower leg: No edema.     Left lower leg: No edema.  Comments: Lower paraspinal muscles tender to palpation bilaterally.  Skin:    General: Skin is warm and dry.     Findings: No rash.  Neurological:     General: No focal deficit present.     Mental Status: He is alert. Mental status is at baseline.     Sensory: No sensory deficit.     Comments: Sensation to light touch intact to bilateral LE  Psychiatric:        Mood and Affect: Mood normal.        Behavior: Behavior normal.     ED Results / Procedures / Treatments   Labs (all labs ordered are listed, but only abnormal results are displayed) Labs Reviewed - No data to display  EKG None  Radiology No results found.  Procedures Procedures    Medications Ordered in ED Medications  oxyCODONE (Oxy IR/ROXICODONE) immediate release tablet 5 mg (5 mg Oral Given 11/10/22 1046)   acetaminophen (TYLENOL) tablet 1,000 mg (1,000 mg Oral Given 11/10/22 1046)  ketorolac (TORADOL) 15 MG/ML injection 15 mg (15 mg Intravenous Given 11/10/22 1044)    ED Course/ Medical Decision Making/ A&P                             Medical Decision Making Risk OTC drugs. Prescription drug management.   This patient presents to the ED for concern of back pain, this involves an extensive number of treatment options, and is a complaint that carries with it a high risk of complications and morbidity.  The differential diagnosis includes pyelonephritis, nephrolithiasis, spinal abscess, osteomyelitis, herniated disc, muscle strain, spinal fracture, meningitis, cancer, cauda equina syndrome.   Co morbidities that complicate the patient evaluation  Chronic back pain    Problem List / ED Course / Critical interventions / Medication management  Patient presents to ED concern for acute on chronic low back pain.  No recent trauma.  Physical exam tenderness to palpation of lower paraspinal muscles.  Patient able to ambulate without difficulty.  Patient called spine center yesterday but has not heard back yet-probably due to holiday.  Patient denying any red flag symptoms.  Provided patient with back pain cocktail which resolved his symptoms.  Educated patient on ways to relieve his lower back pain.  Patient asking for long term narcotics, which I educated patient why we do not give outpatient narcotics for chronic back pain.  Offered muscle relaxers-patient stating that he has plenty of muscle relaxers at home.  Educated patient on safe muscle relaxer use.  Also educated patient on alternating Tylenol and ibuprofen for pain management.  Patient verbalized understanding.  Recommend patient give the spine center a couple more days to contact him since it is currently a long holiday weekend. Patient denies urinary retention, fecal incontinence, saddle anesthesia, lower extremity weakness, hx of cancer,  fever, immunosuppression, IVDU, spinal procedure, significant trauma - so imaging was not appropriate at this time. Will proceed with conservative therapy and have patient follow up with PCP. I have reviewed the patients home medicines and have made adjustments as needed Patient afebrile with stable vitals. Patient ready for discharge. Provided patient with  return precaution.  Ddx: these are considered less likely due to history of present illness and physical exam -Pyelonephritis/nephrolithiasis: no abdominal or flank pain; no urinary symptoms -spinal abscess: no fever, no skin findings, no history of IVDU -osteomyelitis: no history of IVDU; pain is acute -herniated disc: pain is bilateral; no sciatica  -  spinal fracture: not a high force trauma associated with pain -meningitis: no fever; lack of meningism symptoms -cancer: symptoms are acute -cauda equina syndrome: denies saddle paresthesia, urinary retention, fecal incontinence   Social Determinants of Health:  none           Final Clinical Impression(s) / ED Diagnoses Final diagnoses:  Chronic bilateral low back pain without sciatica    Rx / DC Orders ED Discharge Orders     None         Dorthy Cooler, [place] 11/12/22 1610    Rozelle Logan, DO 11/20/22 1611

## 2022-11-10 NOTE — ED Notes (Signed)
Went to d/c pt and he was gone.

## 2022-11-10 NOTE — Discharge Instructions (Addendum)
I am glad you are feeling better.  I recommend following up with the back pain clinic you are referred to.  Seek emergency care if experiencing any new or worsening symptoms.  Alternating between 650 mg Tylenol and 400 mg Advil: The best way to alternate taking Acetaminophen (example Tylenol) and Ibuprofen (example Advil/Motrin) is to take them 3 hours apart. For example, if you take ibuprofen at 6 am you can then take Tylenol at 9 am. You can continue this regimen throughout the day, making sure you do not exceed the recommended maximum dose for each drug.

## 2022-11-10 NOTE — ED Triage Notes (Signed)
Patient reports back pain x 1.5 weeks ago. Patient was supposed to see ortho and was unable to d/t cost. Patient took tylenol and ibuprofen without relief.

## 2023-03-27 ENCOUNTER — Ambulatory Visit: Payer: Self-pay | Attending: Nurse Practitioner | Admitting: Nurse Practitioner

## 2023-07-05 ENCOUNTER — Ambulatory Visit (HOSPITAL_COMMUNITY)
Admission: EM | Admit: 2023-07-05 | Discharge: 2023-07-05 | Disposition: A | Discharge status: Home / Self Care | Payer: No Payment, Other | Attending: Psychiatry | Admitting: Psychiatry

## 2023-07-05 DIAGNOSIS — F3130 Bipolar disorder, current episode depressed, mild or moderate severity, unspecified: Secondary | ICD-10-CM

## 2023-07-05 DIAGNOSIS — F319 Bipolar disorder, unspecified: Secondary | ICD-10-CM | POA: Insufficient documentation

## 2023-07-05 NOTE — Discharge Instructions (Addendum)
 Discharge recommendations:   Medications: No new medications started at this visit. Patient is to take medications as prescribed. The patient or patient's guardian is to contact a medical professional and/or outpatient provider to address any new side effects that develop. The patient or the patient's guardian should update outpatient providers of any new medications and/or medication changes.    Outpatient Follow up: Please review list of outpatient resources for psychiatry and counseling. Please follow up with your primary care provider for all medical related needs.   Therapy: We recommend that patient participate in individual therapy to address mental health concerns.  Safety:   The following safety precautions should be taken:   No sharp objects. This includes scissors, razors, scrapers, and putty knives.   Chemicals should be removed and locked up.   Medications should be removed and locked up.   Weapons should be removed and locked up. This includes firearms, knives and instruments that can be used to cause injury.   The patient should abstain from use of illicit substances/drugs and abuse of any medications.  If symptoms worsen or do not continue to improve or if the patient becomes actively suicidal or homicidal then it is recommended that the patient return to the closest hospital emergency department, the [facility], or call 911 for further evaluation and treatment. National Suicide Prevention Lifeline 1-800-SUICIDE or 360-047-1459.  About 988: 988 offers 24/7 access to trained crisis counselors who can help people experiencing mental health-related distress. People can call or text 988 or chat 988lifeline.org for themselves or if they are worried about a loved one who may need crisis support.
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
## 2023-07-05 NOTE — ED Notes (Signed)
 Pt discharged with  AVS.  AVS reviewed prior to discharge.  Pt alert, oriented, and ambulatory.  Safety maintained.

## 2023-07-09 ENCOUNTER — Encounter (HOSPITAL_COMMUNITY): Payer: Self-pay | Admitting: Licensed Clinical Social Worker

## 2023-07-09 ENCOUNTER — Ambulatory Visit (INDEPENDENT_AMBULATORY_CARE_PROVIDER_SITE_OTHER): Payer: Self-pay | Admitting: Licensed Clinical Social Worker

## 2023-07-09 DIAGNOSIS — F314 Bipolar disorder, current episode depressed, severe, without psychotic features: Secondary | ICD-10-CM | POA: Diagnosis not present

## 2023-07-09 DIAGNOSIS — F411 Generalized anxiety disorder: Secondary | ICD-10-CM
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
## 2023-07-10 ENCOUNTER — Encounter (HOSPITAL_COMMUNITY): Payer: Self-pay | Admitting: Physician Assistant

## 2023-07-10 ENCOUNTER — Emergency Department (HOSPITAL_COMMUNITY)
Admission: EM | Admit: 2023-07-10 | Discharge: 2023-07-10 | Disposition: A | Discharge status: Home / Self Care | Payer: Self-pay | Attending: Emergency Medicine | Admitting: Emergency Medicine

## 2023-07-10 ENCOUNTER — Ambulatory Visit (INDEPENDENT_AMBULATORY_CARE_PROVIDER_SITE_OTHER): Admitting: Physician Assistant

## 2023-07-10 ENCOUNTER — Encounter (HOSPITAL_COMMUNITY): Payer: Self-pay

## 2023-07-10 ENCOUNTER — Other Ambulatory Visit: Payer: Self-pay

## 2023-07-10 VITALS — BP 110/90 | HR 86 | Temp 97.5°F | Ht 71.0 in | Wt 229.4 lb

## 2023-07-10 DIAGNOSIS — F314 Bipolar disorder, current episode depressed, severe, without psychotic features: Secondary | ICD-10-CM

## 2023-07-10 DIAGNOSIS — F411 Generalized anxiety disorder: Secondary | ICD-10-CM

## 2023-07-10 DIAGNOSIS — A64 Unspecified sexually transmitted disease: Secondary | ICD-10-CM | POA: Insufficient documentation

## 2023-07-10 DIAGNOSIS — R300 Dysuria: Secondary | ICD-10-CM | POA: Diagnosis present

## 2023-07-10 DIAGNOSIS — R109 Unspecified abdominal pain: Secondary | ICD-10-CM | POA: Diagnosis not present

## 2023-07-10 LAB — COMPREHENSIVE METABOLIC PANEL
ALT: 23 U/L (ref 0–44)
AST: 24 U/L (ref 15–41)
Albumin: 4.1 g/dL (ref 3.5–5.0)
Alkaline Phosphatase: 83 U/L (ref 38–126)
Anion gap: 17 — ABNORMAL HIGH (ref 5–15)
BUN: 12 mg/dL (ref 6–20)
CO2: 24 mmol/L (ref 22–32)
Calcium: 10.3 mg/dL (ref 8.9–10.3)
Chloride: 98 mmol/L (ref 98–111)
Creatinine, Ser: 0.96 mg/dL (ref 0.61–1.24)
GFR, Estimated: >60 mL/min (ref >60)
Glucose, Bld: 201 mg/dL — ABNORMAL HIGH (ref 70–99)
Potassium: 3.7 mmol/L (ref 3.5–5.1)
Sodium: 139 mmol/L (ref 135–145)
Total Bilirubin: 0.6 mg/dL (ref 0.0–1.2)
Total Protein: 7.3 g/dL (ref 6.5–8.1)

## 2023-07-10 LAB — CBC
HCT: 45.5 % (ref 39.0–52.0)
Hemoglobin: 15.2 g/dL (ref 13.0–17.0)
MCH: 28.4 pg (ref 26.0–34.0)
MCHC: 33.4 g/dL (ref 30.0–36.0)
MCV: 84.9 fL (ref 80.0–100.0)
Platelets: 260 10*3/uL (ref 150–400)
RBC: 5.36 MIL/uL (ref 4.22–5.81)
RDW: 14 % (ref 11.5–15.5)
WBC: 5.9 10*3/uL (ref 4.0–10.5)
nRBC: 0 % (ref 0.0–0.2)

## 2023-07-10 LAB — URINALYSIS, ROUTINE W REFLEX MICROSCOPIC
Bacteria, UA: NONE SEEN
Bilirubin Urine: NEGATIVE
Glucose, UA: >=500 mg/dL — AB
Hgb urine dipstick: NEGATIVE
Ketones, ur: NEGATIVE mg/dL
Leukocytes,Ua: NEGATIVE
Nitrite: NEGATIVE
Protein, ur: NEGATIVE mg/dL
Specific Gravity, Urine: 1.028 (ref 1.005–1.030)
pH: 5 (ref 5.0–8.0)

## 2023-07-10 LAB — LIPASE, BLOOD: Lipase: 48 U/L (ref 11–51)

## 2023-07-10 MED ORDER — DOXYCYCLINE HYCLATE 100 MG PO CAPS: 100.0000 mg | ORAL_CAPSULE | Freq: Two times a day (BID) | ORAL | 0 refills | Status: AC

## 2023-07-10 MED ORDER — CEFTRIAXONE SODIUM 1 G IJ SOLR
1.0000 g | Freq: Once | INTRAMUSCULAR | Status: AC
  Administered 2023-07-10: 1 g via INTRAMUSCULAR
  Filled 2023-07-10: qty 10

## 2023-07-10 MED ORDER — STERILE WATER FOR INJECTION IJ SOLN
INTRAMUSCULAR | Status: AC
Start: 2023-07-10 — End: 2023-07-10
  Filled 2023-07-10: qty 10

## 2023-07-10 MED ORDER — DOXYCYCLINE HYCLATE 100 MG PO TABS
100.0000 mg | ORAL_TABLET | Freq: Once | ORAL | Status: AC
  Administered 2023-07-10: 100 mg via ORAL
  Filled 2023-07-10: qty 1

## 2023-07-10 MED ORDER — LURASIDONE HCL 40 MG PO TABS: 20.0000 mg | ORAL_TABLET | Freq: Every day | ORAL | Status: AC

## 2023-07-10 NOTE — ED Triage Notes (Signed)
 Pt c/o abdominal pain, pain with urination, and swelling in groin area. Pt denies discharge. Pt has urinary urgency and urinary frequency.
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
## 2023-07-10 NOTE — ED Provider Notes (Signed)
 [place] EMERGENCY DEPARTMENT AT [facility] Provider Note   CSN: [id number] Arrival date & time: 07/10/23  1057     History Chief Complaint  Patient presents with   Abdominal Pain    HPI Evan Wiley is a 43 y.o. male presenting for chief complaint of abdominal pain.  Realistically he endorses that he had a new sexual contact and has been having dysuria with every bathroom utilization of last 48 hours.  He does not think he is having discharge but is uncertain.  Endorses a history of similar.  Denies fevers chills nausea vomiting shortness of breath.   Patient's recorded medical, surgical, social, medication list and allergies were reviewed in the Snapshot window as part of the initial history.   Review of Systems   Review of Systems  Constitutional:  Negative for chills and fever.  HENT:  Negative for ear pain and sore throat.   Eyes:  Negative for pain and visual disturbance.  Respiratory:  Negative for cough and shortness of breath.   Cardiovascular:  Negative for chest pain and palpitations.  Gastrointestinal:  Negative for abdominal pain and vomiting.  Genitourinary:  Positive for dysuria. Negative for hematuria.  Musculoskeletal:  Negative for arthralgias and back pain.  Skin:  Negative for color change and rash.  Neurological:  Negative for seizures and syncope.  All other systems reviewed and are negative.   Physical Exam Updated Vital Signs BP 129/87 (BP Location: Right Arm)   Pulse 94   Temp 98.3 F (36.8 C)   Resp 16   Ht 5\' 11"  (1.803 m)   Wt 104.3 kg   SpO2 98%   BMI 32.08 kg/m  Physical Exam Vitals and nursing note reviewed.  Constitutional:      General: He is not in acute distress.    Appearance: He is well-developed.  HENT:     Head: Normocephalic and atraumatic.  Eyes:     Conjunctiva/sclera: Conjunctivae normal.  Cardiovascular:     Rate and Rhythm: Normal rate and regular rhythm.     Heart sounds: No murmur  heard. Pulmonary:     Effort: Pulmonary effort is normal. No respiratory distress.     Breath sounds: Normal breath sounds.  Abdominal:     Palpations: Abdomen is soft.     Tenderness: There is no abdominal tenderness.  Musculoskeletal:        General: No swelling.     Cervical back: Neck supple.  Skin:    General: Skin is warm and dry.     Capillary Refill: Capillary refill takes less than 2 seconds.  Neurological:     Mental Status: He is alert.  Psychiatric:        Mood and Affect: Mood normal.      ED Course/ Medical Decision Making/ A&P    Procedures Procedures   Medications Ordered in ED Medications  cefTRIAXone (ROCEPHIN) injection 1 g (has no administration in time range)  doxycycline (VIBRA-TABS) tablet 100 mg (has no administration in time range)   MDM: 43 year old male presenting with dysuria after new sexual contact. Urine does not show obvious infection.  He does have hyperglycemia that I recommended he follow-up with a PCP on his blood work. Likely STI given his reported symptoms. Will treat with Rocephin and 10 days of Doxy and recommend follow-up in the outpatient setting with [facility]. Unfortunately, HIV/RPR testing were not done in triage.  He does not want to undergo both a Rocephin shot and IV access  as he is running out of time before another appointment. He will follow-up with the local health department.  Disposition:  I have considered need for hospitalization, however, considering all of the above, I believe this patient is stable for discharge at this time.  Patient/family educated about specific return precautions for given chief complaint and symptoms.  Patient/family educated about follow-up with PCP.     Patient/family expressed understanding of return precautions and need for follow-up. Patient spoken to regarding all imaging and laboratory results and appropriate follow up for these results. All education provided in verbal form with  additional information in written form. Time was allowed for answering of patient questions. Patient discharged.    Emergency Department Medication Summary:   Medications  cefTRIAXone (ROCEPHIN) injection 1 g (has no administration in time range)  doxycycline (VIBRA-TABS) tablet 100 mg (has no administration in time range)        Clinical Impression:  1. STD (male)      Discharge   Final Clinical Impression(s) / ED Diagnoses Final diagnoses:  STD (male)    Rx / DC Orders ED Discharge Orders          Ordered    doxycycline (VIBRAMYCIN) 100 MG capsule  2 times daily        07/10/23 1415              Glyn Ade, MD 07/10/23 1416

## 2023-07-12 LAB — GC/CHLAMYDIA PROBE AMP (~~LOC~~) NOT AT ARMC
Chlamydia: NEGATIVE
Comment: NEGATIVE
Comment: NORMAL
Neisseria Gonorrhea: NEGATIVE

## 2023-07-19 ENCOUNTER — Telehealth (HOSPITAL_COMMUNITY): Payer: Self-pay | Admitting: Physician Assistant

## 2023-07-20 NOTE — Telephone Encounter (Signed)
 Provider reached out to patient regarding paperwork concerns. Provider informed patient that his paperwork would be completed and available for pick up next week. Patient vocalized understanding.

## 2023-07-24 ENCOUNTER — Telehealth (HOSPITAL_COMMUNITY): Payer: Self-pay | Admitting: Physician Assistant

## 2023-07-25 ENCOUNTER — Encounter (HOSPITAL_COMMUNITY): Payer: Self-pay | Admitting: Physician Assistant

## 2023-07-25 NOTE — Telephone Encounter (Signed)
 Message acknowledged and reviewed. Documents filled out.

## 2023-08-21 ENCOUNTER — Encounter (HOSPITAL_COMMUNITY): Payer: Self-pay | Admitting: Physician Assistant

## 2023-08-23 ENCOUNTER — Ambulatory Visit (HOSPITAL_BASED_OUTPATIENT_CLINIC_OR_DEPARTMENT_OTHER): Payer: Self-pay | Admitting: Family Medicine
